# Patient Record
Sex: Male | Born: 1970 | Race: White | Hispanic: No | Marital: Married | Smoking: Former smoker
Health system: Southern US, Community
[De-identification: ages and names within clinical notes are randomized; demographics above are authoritative.]

## PROBLEM LIST (undated history)

## (undated) DIAGNOSIS — E785 Hyperlipidemia, unspecified: Secondary | ICD-10-CM

## (undated) DIAGNOSIS — I10 Essential (primary) hypertension: Secondary | ICD-10-CM

## (undated) HISTORY — PX: FINGER SURGERY: SHX640

## (undated) HISTORY — DX: Hyperlipidemia, unspecified: E78.5

---

## 2007-07-09 ENCOUNTER — Ambulatory Visit: Payer: Self-pay | Admitting: Family Medicine

## 2014-10-31 DIAGNOSIS — E785 Hyperlipidemia, unspecified: Secondary | ICD-10-CM | POA: Insufficient documentation

## 2014-11-05 ENCOUNTER — Ambulatory Visit: Payer: Self-pay | Admitting: Family Medicine

## 2015-01-09 ENCOUNTER — Encounter: Payer: Self-pay | Admitting: Emergency Medicine

## 2015-01-09 ENCOUNTER — Emergency Department
Admission: EM | Admit: 2015-01-09 | Discharge: 2015-01-09 | Disposition: A | Discharge status: Home / Self Care | Attending: Emergency Medicine | Admitting: Emergency Medicine

## 2015-01-09 DIAGNOSIS — X58XXXA Exposure to other specified factors, initial encounter: Secondary | ICD-10-CM | POA: Insufficient documentation

## 2015-01-09 DIAGNOSIS — Y9289 Other specified places as the place of occurrence of the external cause: Secondary | ICD-10-CM | POA: Diagnosis not present

## 2015-01-09 DIAGNOSIS — T1592XA Foreign body on external eye, part unspecified, left eye, initial encounter: Secondary | ICD-10-CM | POA: Diagnosis not present

## 2015-01-09 DIAGNOSIS — Y998 Other external cause status: Secondary | ICD-10-CM | POA: Insufficient documentation

## 2015-01-09 DIAGNOSIS — Z79899 Other long term (current) drug therapy: Secondary | ICD-10-CM | POA: Insufficient documentation

## 2015-01-09 DIAGNOSIS — Y9389 Activity, other specified: Secondary | ICD-10-CM | POA: Insufficient documentation

## 2015-01-09 MED ORDER — FLUORESCEIN SODIUM 1 MG OP STRP
1.0000 | ORAL_STRIP | Freq: Once | OPHTHALMIC | Status: AC
  Administered 2015-01-09: 1 via OPHTHALMIC
  Filled 2015-01-09 (×2): qty 1

## 2015-01-09 MED ORDER — TETRACAINE HCL 0.5 % OP SOLN
1.0000 [drp] | Freq: Once | OPHTHALMIC | Status: AC
  Administered 2015-01-09: 1 [drp] via OPHTHALMIC
  Filled 2015-01-09 (×2): qty 2

## 2015-01-09 MED ORDER — EYE WASH OPHTH SOLN
1.0000 [drp] | OPHTHALMIC | Status: DC | PRN
  Filled 2015-01-09 (×2): qty 118

## 2015-01-09 MED ORDER — TOBRAMYCIN 0.3 % OP SOLN
2.0000 [drp] | OPHTHALMIC | Status: DC
  Administered 2015-01-09: 2 [drp] via OPHTHALMIC
  Filled 2015-01-09: qty 5

## 2015-01-09 NOTE — Discharge Instructions (Signed)
Eye, Foreign Body °A foreign body is an object that should not be there. The object could be near, on, or in the eye. °HOME CARE °If your doctor prescribes an eye patch: °· Keep the eye patch on. Do this until you see your doctor again. °· Do not remove the patch to put in medicine unless your doctor tells you. °· Retape it as it was before: °¨ When replacing the patch. °¨ If the patch comes loose. °· Do not drive or use machinery. °· Only take medicine as told by your doctor. °If your doctor does not prescribe an eye patch: °· Keep the eye closed as much as possible. °· Do not rub the eye. °· Wear dark glasses in bright light. °· Do not wear contact lenses until the eye feels normal, or as told by your doctor. °· Wear protective eye covering, especially when using high speed tools. °· Only take medicine as told by your doctor. °GET HELP RIGHT AWAY IF:  °· Your pain gets worse. °· Your vision changes. °· You have problems with the eye patch. °· The injury gets larger. °· There is fluid (discharge) coming from the eye. °· You get puffiness (swelling) and soreness. °· You have an oral temperature above 102° F (38.9° C), not controlled by medicine. °· Your baby is older than 3 months with a rectal temperature of 102° F (38.9° C) or higher. °· Your baby is 3 months old or younger with a rectal temperature of 100.4° F (38° C) or higher. °MAKE SURE YOU:  °· Understand these instructions. °· Will watch your condition. °· Will get help right away if you are not doing well or get worse. °Document Released: 10/01/2009 Document Revised: 07/06/2011 Document Reviewed: 09/08/2012 °ExitCare® Patient Information ©2015 [employer]. This information is not intended to replace advice given to you by your health care provider. Make sure you discuss any questions you have with your health care provider. ° °

## 2015-01-09 NOTE — ED Notes (Signed)
Pt arrived to the ED for complaint of a foreign object in the left eye. Pt states that he was mowing the yard and thinks that is dirt. Pt is AOx4 in no apparent distress.

## 2015-01-09 NOTE — ED Provider Notes (Signed)
[facility] Emergency Department Provider Note  ____________________________________________  Time seen: Approximately 10:22 PM  I have reviewed the triage vital signs and the nursing notes.   HISTORY  Chief Complaint Foreign Body in Eye   HPI Jose Mosley is a 44 y.o. male is here with complaint of foreign body to left eye. He states he was mowing the yard and thinks that might be dirt. He says he took a shower and felt it at that time. He denies any visual problems other then the discomfort. He is tried multiple times at home to flush it out himself.Currently his pain is 1 out of 10.   Past Medical History  Diagnosis Date  . Hyperlipidemia     Patient Active Problem List   Diagnosis Date Noted  . Hyperlipidemia 10/31/2014    Past Surgical History  Procedure Laterality Date  . Finger surgery      Current Outpatient Rx  Name  Route  Sig  Dispense  Refill  . atorvastatin (LIPITOR) 40 MG tablet   Oral   Take 40 mg by mouth daily.           Allergies Review of patient's allergies indicates no known allergies.  Family History  Problem Relation Age of Onset  . Alcohol abuse Mother   . Diabetes Mother   . Heart disease Mother   . Hyperlipidemia Mother   . Stroke Mother   . Heart disease Father   . Hyperlipidemia Father   . Hypertension Father   . Stroke Father     Social History Social History  Substance Use Topics  . Smoking status: Never Smoker   . Smokeless tobacco: None  . Alcohol Use: No    Review of Systems Constitutional: No fever/chills Eyes: No visual changes. Cardiovascular: Denies chest pain. Respiratory: Denies shortness of breath.   10-point ROS otherwise negative.  ____________________________________________   PHYSICAL EXAM:  VITAL SIGNS: ED Triage Vitals  Enc Vitals Group     BP 01/09/15 2050 149/94 mmHg     Pulse Rate 01/09/15 2050 70     Resp 01/09/15 2050 18     Temp 01/09/15 2050 98.3 F  (36.8 C)     Temp Source 01/09/15 2050 Oral     SpO2 01/09/15 2050 97 %     Weight 01/09/15 2050 203 lb (92.08 kg)     Height 01/09/15 2050 5\' 8"  (1.727 m)     Head Cir --      Peak Flow --      Pain Score 01/09/15 2051 1     Pain Loc --      Pain Edu? --      Excl. in [place]? --     Constitutional: Alert and oriented. Well appearing and in no acute distress. Eyes: Conjunctivae are normal. PERRL. EOMI. Head: Atraumatic. Nose: No congestion/rhinnorhea. Neck: No stridor.   Marland KitchenRespiratory: Normal respiratory effort.  No retractions. Gastrointestinal: Soft and nontender. No distention. No abdominal bruits. No CVA tenderness. Musculoskeletal: No lower extremity tenderness nor edema.   Neurologic:  Normal speech and language. No gross focal neurologic deficits are appreciated. No gait instability. Skin:  Skin is warm, dry and intact. No rash noted. Psychiatric: Mood and affect are normal. Speech and behavior are normal.  ____________________________________________   LABS (all labs ordered are listed, but only abnormal results are displayed)  Labs Reviewed - No data to display  PROCEDURES  Procedure(s) performed: Tetracaine was placed in the left eye. Left eyelid was  inverted with a foreign body noted on the eyelid. This was removed with a wet Q-tip. Floor seen dye was placed in the eye after this and inspected without any corneal abrasion noted. Eye was irrigated with ophthalmic solution.  Critical Care performed: No  ____________________________________________   INITIAL IMPRESSION / ASSESSMENT AND PLAN / ED COURSE  Pertinent labs & imaging results that were available during my care of the patient were reviewed by me and considered in my medical decision making (see chart for details).  Patient was given ophthalmic eyedrops while in the emergency room since the pharmacies are closed. He is to follow-up with [facility] if any problems and 1  day. ____________________________________________   FINAL CLINICAL IMPRESSION(S) / ED DIAGNOSES  Final diagnoses:  Foreign body, eye, left, initial encounter      Johnn Hai, PA-C 01/09/15 2318  Daymon Larsen, MD 01/09/15 2320

## 2015-03-07 ENCOUNTER — Encounter: Payer: Self-pay | Admitting: Family Medicine

## 2015-03-07 ENCOUNTER — Ambulatory Visit (INDEPENDENT_AMBULATORY_CARE_PROVIDER_SITE_OTHER): Payer: Worker's Compensation | Admitting: Family Medicine

## 2015-03-07 VITALS — BP 127/78 | HR 60 | Temp 97.9°F | Ht 67.3 in | Wt 183.0 lb

## 2015-03-07 DIAGNOSIS — Z2089 Contact with and (suspected) exposure to other communicable diseases: Secondary | ICD-10-CM | POA: Insufficient documentation

## 2015-03-07 NOTE — Assessment & Plan Note (Signed)
Low risk exposure with no symptoms no need for prophylaxis

## 2015-03-07 NOTE — Progress Notes (Signed)
   BP 127/78 mmHg  Pulse 60  Temp(Src) 97.9 F (36.6 C)  Ht 5' 7.3" (1.709 m)  Wt 183 lb (83.008 kg)  BMI 28.42 kg/m2  SpO2 99%   Subjective:    Patient ID: Jose Mosley, male    DOB: 10-27-1970, 44 y.o.   MRN: [id number]  HPI: Jose Mosley is a 44 y.o. male  Chief Complaint  Patient presents with  . 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 Patient with absolutely no complaints no signs or symptoms of any illness, no fever or chills no neck stiffness no back stiffness no headache or other symptoms. Patient had no direct contact or exposure with patient Was at most 3 feet away from the patient who is intubated during the entire time. On review of company criteria for high risk exposure patient had no high risk exposure to meningitis.  Relevant past medical, surgical, family and social history reviewed and updated as indicated. Interim medical history since our last visit reviewed. Allergies and medications reviewed and updated.  Review of Systems  Constitutional: Negative.   HENT: Negative.   Eyes: Negative.   Respiratory: Negative.   Cardiovascular: Negative.   Gastrointestinal: Negative.   Endocrine: Negative.   Genitourinary: Negative.   Musculoskeletal: Negative.   Skin: Negative.   Allergic/Immunologic: Negative.   Neurological: Negative.   Hematological: Negative.   Psychiatric/Behavioral: Negative.     Per HPI unless specifically indicated above     Objective:    BP 127/78 mmHg  Pulse 60  Temp(Src) 97.9 F (36.6 C)  Ht 5' 7.3" (1.709 m)  Wt 183 lb (83.008 kg)  BMI 28.42 kg/m2  SpO2 99%  Wt Readings from Last 3 Encounters:  03/07/15 183 lb (83.008 kg)  01/09/15 203 lb (92.08 kg)  09/21/14 205 lb (92.987 kg)    Physical Exam  Constitutional: He is oriented to person, place, and time. He appears well-developed and well-nourished. No  distress.  HENT:  Head: Normocephalic and atraumatic.  Right Ear: Hearing and external ear normal.  Left Ear: Hearing and external ear normal.  Nose: Nose normal.  Mouth/Throat: Oropharynx is clear and moist.  Eyes: Conjunctivae, EOM and lids are normal. Pupils are equal, round, and reactive to light. Right eye exhibits no discharge. Left eye exhibits no discharge. No scleral icterus.  Neck: Normal range of motion. Neck supple. Thyromegaly present.  Cardiovascular: Normal rate, regular rhythm and normal heart sounds.   Pulmonary/Chest: Effort normal and breath sounds normal. No respiratory distress.  Musculoskeletal: Normal range of motion.  No neck or back soreness stiffness or limited range of motion  Lymphadenopathy:    He has no cervical adenopathy.  Neurological: He is alert and oriented to person, place, and time.  Skin: Skin is intact. No rash noted.  Psychiatric: He has a normal mood and affect. His speech is normal and behavior is normal. Judgment and thought content normal. Cognition and memory are normal.    No results found for this or any previous visit.    Assessment & Plan:   Problem List Items Addressed This Visit      Other   Meningitis exposure - Primary    Low risk exposure with no symptoms no need for prophylaxis          Follow up plan: Return for As scheduled.

## 2015-05-16 ENCOUNTER — Ambulatory Visit (INDEPENDENT_AMBULATORY_CARE_PROVIDER_SITE_OTHER): Admitting: Family Medicine

## 2015-05-16 ENCOUNTER — Encounter: Payer: Self-pay | Admitting: Family Medicine

## 2015-05-16 ENCOUNTER — Other Ambulatory Visit: Payer: Self-pay | Admitting: Family Medicine

## 2015-05-16 DIAGNOSIS — E785 Hyperlipidemia, unspecified: Secondary | ICD-10-CM

## 2015-05-16 LAB — LP+ALT+AST PICCOLO, WAIVED
ALT (SGPT) Piccolo, Waived: 21 U/L (ref 10–47)
AST (SGOT) Piccolo, Waived: 35 U/L (ref 11–38)
CHOL/HDL RATIO PICCOLO,WAIVE: 1.9 mg/dL
CHOLESTEROL PICCOLO, WAIVED: 158 mg/dL (ref <200)
HDL CHOL PICCOLO, WAIVED: 85 mg/dL (ref >59)
LDL CHOL CALC PICCOLO WAIVED: 61 mg/dL (ref <100)
TRIGLYCERIDES PICCOLO,WAIVED: 58 mg/dL (ref <150)
VLDL CHOL CALC PICCOLO,WAIVE: 12 mg/dL (ref <30)

## 2015-05-16 MED ORDER — ATORVASTATIN CALCIUM 40 MG PO TABS: 40.0000 mg | ORAL_TABLET | Freq: Every day | ORAL | Status: DC

## 2015-05-16 NOTE — Progress Notes (Signed)
BP 115/73 mmHg  Pulse 81  Temp(Src) 98 F (36.7 C)  Ht 5' 7.2" (1.707 m)  Wt 182 lb (82.555 kg)  BMI 28.33 kg/m2  SpO2 98%   Subjective:    Patient ID: Jose Mosley, male    DOB: [birth year], 45 y.o.   MRN: [id number]  HPI: Jose Mosley is a 45 y.o. male  Chief Complaint  Patient presents with  . Hyperlipidemia   HYPERLIPIDEMIA Hyperlipidemia status: controlled Sasfied with current treatment?  no Side effects:  no Medication compliance: excellent compliance Supplements: none Aspirin:  no Chest pain:  no Coronary artery disease:  no Family history CAD:  yes Family history early CAD:  yes   To be going to [place] in September, just got some shots for travel. To talk to Dr. Jeananne Rama later regarding medication when he is flying.   Relevant past medical, surgical, family and social history reviewed and updated as indicated. Interim medical history since our last visit reviewed. Allergies and medications reviewed and updated.  Review of Systems  Constitutional: Negative.   Respiratory: Negative.   Cardiovascular: Negative.   Gastrointestinal: Negative.   Psychiatric/Behavioral: Negative.     Per HPI unless specifically indicated above     Objective:    BP 115/73 mmHg  Pulse 81  Temp(Src) 98 F (36.7 C)  Ht 5' 7.2" (1.707 m)  Wt 182 lb (82.555 kg)  BMI 28.33 kg/m2  SpO2 98%  Wt Readings from Last 3 Encounters:  05/16/15 182 lb (82.555 kg)  03/07/15 183 lb (83.008 kg)  01/09/15 203 lb (92.08 kg)    Physical Exam  Constitutional: He is oriented to person, place, and time. He appears well-developed and well-nourished. No distress.  HENT:  Head: Normocephalic and atraumatic.  Right Ear: Hearing normal.  Left Ear: Hearing normal.  Nose: Nose normal.  Eyes: Conjunctivae and lids are normal. Right eye exhibits no discharge. Left eye exhibits no discharge. No scleral icterus.  Cardiovascular: Normal rate, regular rhythm, normal heart sounds and intact  distal pulses.  Exam reveals no gallop and no friction rub.   No murmur heard. Pulmonary/Chest: Effort normal and breath sounds normal. No respiratory distress. He has no wheezes. He has no rales. He exhibits no tenderness.  Musculoskeletal: Normal range of motion.  Neurological: He is alert and oriented to person, place, and time.  Skin: Skin is warm, dry and intact. No rash noted. No erythema. No pallor.  Psychiatric: He has a normal mood and affect. His speech is normal and behavior is normal. Judgment and thought content normal. Cognition and memory are normal.  Nursing note and vitals reviewed.   No results found for this or any previous visit.    Assessment & Plan:   Problem List Items Addressed This Visit      Other   Hyperlipidemia    Under good control at this time. Continue current regimen. Continue to monitor. Recheck at PE in 6 months.      Relevant Medications   atorvastatin (LIPITOR) 40 MG tablet       Follow up plan: Return in about 6 months (around 11/13/2015) for PE with MAC.

## 2015-05-16 NOTE — Assessment & Plan Note (Signed)
Under good control at this time. Continue current regimen. Continue to monitor. Recheck at PE in 6 months.

## 2015-08-09 DIAGNOSIS — M7540 Impingement syndrome of unspecified shoulder: Secondary | ICD-10-CM | POA: Insufficient documentation

## 2015-08-09 DIAGNOSIS — M25559 Pain in unspecified hip: Secondary | ICD-10-CM | POA: Insufficient documentation

## 2015-08-27 ENCOUNTER — Encounter: Payer: Self-pay | Admitting: Family Medicine

## 2015-08-27 ENCOUNTER — Ambulatory Visit (INDEPENDENT_AMBULATORY_CARE_PROVIDER_SITE_OTHER): Payer: Self-pay | Admitting: Family Medicine

## 2015-08-27 VITALS — BP 118/68 | HR 81 | Ht 68.2 in | Wt 174.0 lb

## 2015-08-27 DIAGNOSIS — Z0289 Encounter for other administrative examinations: Secondary | ICD-10-CM

## 2015-08-27 DIAGNOSIS — E785 Hyperlipidemia, unspecified: Secondary | ICD-10-CM

## 2015-08-27 NOTE — Progress Notes (Signed)
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

## 2015-11-14 ENCOUNTER — Ambulatory Visit: Admitting: Family Medicine

## 2015-12-26 ENCOUNTER — Encounter: Payer: Self-pay | Admitting: Family Medicine

## 2015-12-26 ENCOUNTER — Ambulatory Visit (INDEPENDENT_AMBULATORY_CARE_PROVIDER_SITE_OTHER): Admitting: Family Medicine

## 2015-12-26 VITALS — BP 131/85 | HR 62 | Temp 98.1°F | Ht 68.1 in | Wt 195.4 lb

## 2015-12-26 DIAGNOSIS — E785 Hyperlipidemia, unspecified: Secondary | ICD-10-CM

## 2015-12-26 LAB — LIPID PANEL PICCOLO, WAIVED
CHOLESTEROL PICCOLO, WAIVED: 172 mg/dL (ref <200)
Chol/HDL Ratio Piccolo,Waive: 2.1 mg/dL
HDL Chol Piccolo, Waived: 81 mg/dL (ref >59)
LDL Chol Calc Piccolo Waived: 67 mg/dL (ref <100)
Triglycerides Piccolo,Waived: 117 mg/dL (ref <150)
VLDL Chol Calc Piccolo,Waive: 23 mg/dL (ref <30)

## 2015-12-26 MED ORDER — ATORVASTATIN CALCIUM 40 MG PO TABS: 40.0000 mg | ORAL_TABLET | Freq: Every day | ORAL | 1 refills | Status: DC

## 2015-12-26 MED ORDER — ACETAZOLAMIDE 250 MG PO TABS: 250.0000 mg | ORAL_TABLET | Freq: Two times a day (BID) | ORAL | 0 refills | Status: DC

## 2015-12-26 NOTE — Patient Instructions (Signed)
Follow up as needed

## 2015-12-26 NOTE — Assessment & Plan Note (Signed)
Continue current regimen, lipid panel today stable and WNL.

## 2015-12-26 NOTE — Progress Notes (Signed)
BP 131/85 (BP Location: Left Arm, Patient Position: Sitting, Cuff Size: Large)   Pulse 62   Temp 98.1 F (36.7 C)   Ht 5' 8.1" (1.73 m)   Wt 195 lb 6.4 oz (88.6 kg)   SpO2 98%   BMI 29.62 kg/m    Subjective:    Patient ID: Jose Mosley, male    DOB: 09/23/1970, 45 y.o.   MRN: [id number]  HPI: Jose Mosley is a 45 y.o. male  Chief Complaint  Patient presents with  . Hyperlipidemia  . Other    pt states he would like a prescription for diamox or acetazolamide, states he is going on a hiking trip and the medication is for altitude.  . Medication Refill    pt states he needs refill for atorvastatin and needs prescriptions printed    Patient presents for hyperlipidemia f/u. Doing very well on atorvastatin, taking faithfully without side effects. Denies CP, SOB, myalgias, or weakness. Also trying to keep a balanced diet and good exercise regimen as well.   Traveling soon and wanting a script for diamox for altitude.   Relevant past medical, surgical, family and social history reviewed and updated as indicated. Interim medical history since our last visit reviewed. Allergies and medications reviewed and updated.  Review of Systems  Constitutional: Negative.   HENT: Negative.   Respiratory: Negative.   Cardiovascular: Negative.   Gastrointestinal: Negative.   Genitourinary: Negative.   Musculoskeletal: Negative.   Neurological: Negative.   Psychiatric/Behavioral: Negative.     Per HPI unless specifically indicated above     Objective:    BP 131/85 (BP Location: Left Arm, Patient Position: Sitting, Cuff Size: Large)   Pulse 62   Temp 98.1 F (36.7 C)   Ht 5' 8.1" (1.73 m)   Wt 195 lb 6.4 oz (88.6 kg)   SpO2 98%   BMI 29.62 kg/m   Wt Readings from Last 3 Encounters:  12/26/15 195 lb 6.4 oz (88.6 kg)  08/27/15 174 lb (78.9 kg)  05/16/15 182 lb (82.6 kg)    Physical Exam  Constitutional: He is oriented to person, place, and time. He appears  well-developed and well-nourished. No distress.  HENT:  Head: Atraumatic.  Eyes: Conjunctivae are normal. No scleral icterus.  Neck: Normal range of motion. Neck supple.  Cardiovascular: Normal rate, regular rhythm and normal heart sounds.   Pulmonary/Chest: Effort normal and breath sounds normal. No respiratory distress.  Musculoskeletal: Normal range of motion.  Neurological: He is alert and oriented to person, place, and time.  Skin: Skin is warm and dry.  Psychiatric: He has a normal mood and affect. His behavior is normal.  Nursing note and vitals reviewed.   Results for orders placed or performed in visit on 12/26/15  Lipid Panel Piccolo, [employer]  Result Value Ref Range   Cholesterol Piccolo, Waived 172 <200 mg/dL   HDL Chol Piccolo, Waived 81 >59 mg/dL   Triglycerides Piccolo,Waived 117 <150 mg/dL   Chol/HDL Ratio Piccolo,Waive 2.1 mg/dL   LDL Chol Calc Piccolo Waived 67 <100 mg/dL   VLDL Chol Calc Piccolo,Waive 23 <30 mg/dL      Assessment & Plan:   Problem List Items Addressed This Visit      Other   Hyperlipidemia - Primary    Continue current regimen, lipid panel today stable and WNL.       Relevant Medications   acetaZOLAMIDE (DIAMOX) 250 MG tablet   atorvastatin (LIPITOR) 40 MG tablet   Other Relevant Orders  Lipid Panel Piccolo, Waived (Completed)    Other Visit Diagnoses   None.      Follow up plan: Return in about 6 months (around 06/24/2016) for Physical Exam.

## 2016-05-07 ENCOUNTER — Other Ambulatory Visit: Payer: Self-pay | Admitting: Family Medicine

## 2016-06-25 ENCOUNTER — Ambulatory Visit: Payer: Self-pay | Admitting: Family Medicine

## 2016-08-27 ENCOUNTER — Telehealth: Payer: Self-pay | Admitting: Family Medicine

## 2016-08-27 NOTE — Telephone Encounter (Signed)
Patient has to be rescheduled do we have any open suggestions before I call patient to reschedule.  Thanks

## 2016-08-27 NOTE — Telephone Encounter (Signed)
Two physical slots available on Monday 08/30/16

## 2016-09-03 ENCOUNTER — Other Ambulatory Visit

## 2016-09-03 ENCOUNTER — Encounter: Payer: Self-pay | Admitting: Family Medicine

## 2016-09-03 ENCOUNTER — Ambulatory Visit (INDEPENDENT_AMBULATORY_CARE_PROVIDER_SITE_OTHER): Payer: Self-pay | Admitting: Family Medicine

## 2016-09-03 ENCOUNTER — Other Ambulatory Visit: Payer: Self-pay

## 2016-09-03 VITALS — BP 132/82 | HR 64 | Ht 68.75 in | Wt 210.0 lb

## 2016-09-03 DIAGNOSIS — Z0289 Encounter for other administrative examinations: Secondary | ICD-10-CM

## 2016-09-03 DIAGNOSIS — E7800 Pure hypercholesterolemia, unspecified: Secondary | ICD-10-CM

## 2016-09-03 DIAGNOSIS — Z029 Encounter for administrative examinations, unspecified: Secondary | ICD-10-CM

## 2016-09-03 LAB — LP+ALT+AST PICCOLO, WAIVED
ALT (SGPT) PICCOLO, WAIVED: 45 U/L (ref 10–47)
AST (SGOT) PICCOLO, WAIVED: 36 U/L (ref 11–38)
CHOLESTEROL PICCOLO, WAIVED: 176 mg/dL (ref <200)
Chol/HDL Ratio Piccolo,Waive: 2 mg/dL
HDL Chol Piccolo, Waived: 89 mg/dL (ref >59)
LDL Chol Calc Piccolo Waived: 70 mg/dL (ref <100)
Triglycerides Piccolo,Waived: 87 mg/dL (ref <150)
VLDL CHOL CALC PICCOLO,WAIVE: 17 mg/dL (ref <30)

## 2016-09-03 NOTE — Progress Notes (Signed)
   BP 132/82   Pulse 64   Ht 5' 8.75" (1.746 m)   Wt 210 lb (95.3 kg)   BMI 31.24 kg/m    Subjective:    Patient ID: Jose Mosley, male    DOB: 1970/08/22, 46 y.o.   MRN: [id number]  HPI: Jose Mosley is a 46 y.o. male  Chief Complaint  Patient presents with  . FAA    CLASS 2     Relevant past medical, surgical, family and social history reviewed and updated as indicated. Interim medical history since our last visit reviewed. Allergies and medications reviewed and updated.  Review of Systems  Constitutional: Negative.   HENT: Negative.   Eyes: Negative.   Respiratory: Negative.   Cardiovascular: Negative.   Gastrointestinal: Negative.   Endocrine: Negative.   Genitourinary: Negative.   Musculoskeletal: Negative.   Skin: Negative.   Allergic/Immunologic: Negative.   Neurological: Negative.   Hematological: Negative.   Psychiatric/Behavioral: Negative.     Per HPI unless specifically indicated above     Objective:    BP 132/82   Pulse 64   Ht 5' 8.75" (1.746 m)   Wt 210 lb (95.3 kg)   BMI 31.24 kg/m   Wt Readings from Last 3 Encounters:  09/03/16 210 lb (95.3 kg)  12/26/15 195 lb 6.4 oz (88.6 kg)  08/27/15 174 lb (78.9 kg)    Physical Exam  Constitutional: He is oriented to person, place, and time. He appears well-developed and well-nourished.  HENT:  Head: Normocephalic.  Right Ear: External ear normal.  Left Ear: External ear normal.  Nose: Nose normal.  Eyes: Conjunctivae and EOM are normal. Pupils are equal, round, and reactive to light.  Neck: Normal range of motion. Neck supple. No thyromegaly present.  Cardiovascular: Normal rate, regular rhythm, normal heart sounds and intact distal pulses.   Pulmonary/Chest: Effort normal and breath sounds normal.  Abdominal: Soft. Bowel sounds are normal. There is no splenomegaly or hepatomegaly.  Genitourinary: Penis normal.  Musculoskeletal: Normal range of motion.  Lymphadenopathy:    He has no  cervical adenopathy.  Neurological: He is alert and oriented to person, place, and time. He has normal reflexes.  Skin: Skin is warm and dry.  Psychiatric: He has a normal mood and affect. His behavior is normal. Judgment and thought content normal.    Results for orders placed or performed in visit on 12/26/15  Lipid Panel Piccolo, [employer]  Result Value Ref Range   Cholesterol Piccolo, Waived 172 <200 mg/dL   HDL Chol Piccolo, Waived 81 >59 mg/dL   Triglycerides Piccolo,Waived 117 <150 mg/dL   Chol/HDL Ratio Piccolo,Waive 2.1 mg/dL   LDL Chol Calc Piccolo Waived 67 <100 mg/dL   VLDL Chol Calc Piccolo,Waive 23 <30 mg/dL      Assessment & Plan:   Problem List Items Addressed This Visit      Other   Hyperlipidemia    The current medical regimen is effective;  continue present plan and medications.       Encounter for [employer]) examination - Primary       Follow up plan: No Follow-up on file.

## 2016-09-03 NOTE — Assessment & Plan Note (Signed)
The current medical regimen is effective;  continue present plan and medications.  

## 2016-09-10 ENCOUNTER — Other Ambulatory Visit: Payer: Self-pay | Admitting: Family Medicine

## 2016-09-10 MED ORDER — ATORVASTATIN CALCIUM 40 MG PO TABS
ORAL_TABLET | ORAL | 0 refills | Status: DC
Start: 2016-09-10 — End: 2017-01-06

## 2016-09-10 NOTE — Telephone Encounter (Signed)
Last OV: 09/03/16 Next OV: None on file.   Lab Results  Component Value Date   CHOL 176 09/03/2016   TRIG 87 09/03/2016   No results found for: CREATININE, BUN, NA, K, CL, CO2

## 2016-09-10 NOTE — Telephone Encounter (Signed)
Patient called stating he needed Dr Jeananne Rama to send in his script for Lipitor 40mg  once daily.  [facility] on [address] [place]  [place]

## 2016-09-14 DIAGNOSIS — Z0289 Encounter for other administrative examinations: Secondary | ICD-10-CM | POA: Insufficient documentation

## 2016-09-14 DIAGNOSIS — Z029 Encounter for administrative examinations, unspecified: Principal | ICD-10-CM

## 2017-01-06 ENCOUNTER — Other Ambulatory Visit: Payer: Self-pay | Admitting: Family Medicine

## 2017-01-06 MED ORDER — ATORVASTATIN CALCIUM 40 MG PO TABS: ORAL_TABLET | ORAL | 0 refills | Status: DC

## 2017-01-06 NOTE — Telephone Encounter (Signed)
Patient called to request a refill on his prescription for Lipitor to [facility] in [place].  Please Advise.  Thank you

## 2017-01-06 NOTE — Telephone Encounter (Signed)
Called and schedule patient for OV in October.

## 2017-02-11 ENCOUNTER — Ambulatory Visit: Payer: Self-pay | Admitting: Family Medicine

## 2017-02-15 ENCOUNTER — Ambulatory Visit: Admitting: Family Medicine

## 2017-09-08 ENCOUNTER — Encounter: Payer: Self-pay | Admitting: Unknown Physician Specialty

## 2017-09-08 ENCOUNTER — Ambulatory Visit (INDEPENDENT_AMBULATORY_CARE_PROVIDER_SITE_OTHER): Admitting: Unknown Physician Specialty

## 2017-09-08 VITALS — BP 149/93 | HR 66 | Temp 98.2°F | Ht 68.5 in | Wt 209.2 lb

## 2017-09-08 DIAGNOSIS — R1011 Right upper quadrant pain: Secondary | ICD-10-CM

## 2017-09-08 LAB — UA/M W/RFLX CULTURE, ROUTINE
BILIRUBIN UA: NEGATIVE
Glucose, UA: NEGATIVE
Ketones, UA: NEGATIVE
Leukocytes, UA: NEGATIVE
NITRITE UA: NEGATIVE
PH UA: 7 (ref 5.0–7.5)
PROTEIN UA: NEGATIVE
Specific Gravity, UA: 1.015 (ref 1.005–1.030)
UUROB: 0.2 mg/dL (ref 0.2–1.0)

## 2017-09-08 LAB — MICROSCOPIC EXAMINATION

## 2017-09-08 NOTE — Progress Notes (Signed)
BP (!) 149/93 (BP Location: Left Arm, Cuff Size: Large)   Pulse 66   Temp 98.2 F (36.8 C) (Oral)   Ht 5' 8.5" (1.74 m)   Wt 209 lb 3.2 oz (94.9 kg)   SpO2 97%   BMI 31.35 kg/m    Subjective:    Patient ID: Jose Mosley, male    DOB: 05-31-1970, 47 y.o.   MRN: [id number]  HPI: Jose Mosley is a 47 y.o. male  Chief Complaint  Patient presents with  . Abdominal Pain    pt states he has had pain in his right side that radiates to the front. States he has noticied it for a few months but has gotten worse in the last 2 weeks    Abdominal Pain  This is a new problem. Episode onset: 3 months. The onset quality is gradual. The problem occurs constantly (comes and goes.  Seeems to more frequent and worse). The problem has been gradually worsening. The pain is located in the RUQ (Radiates to back). The quality of the pain is aching. The abdominal pain radiates to the back. Pertinent negatives include no anorexia, arthralgias, belching, constipation, diarrhea, dysuria, fever, flatus, frequency, headaches, hematochezia, hematuria, melena, myalgias, nausea, vomiting or weight loss. The pain is aggravated by eating (expecially after dinner which is a big meal). Relieved by: movement. He has tried nothing for the symptoms. There is no history of abdominal surgery, gallstones, GERD or irritable bowel syndrome.   Was drinking heavily but quit once concerned about pain  Social History   Socioeconomic History  . Marital status: Married    Spouse name: Not on file  . Number of children: Not on file  . Years of education: Not on file  . Highest education level: Not on file  Occupational History  . Not on file  Social Needs  . Financial resource strain: Not on file  . Food insecurity:    Worry: Not on file    Inability: Not on file  . Transportation needs:    Medical: Not on file    Non-medical: Not on file  Tobacco Use  . Smoking status: Former Smoker    Last attempt to quit:  03/06/1990    Years since quitting: 27.5  . Smokeless tobacco: Never Used  Substance and Sexual Activity  . Alcohol use: No    Alcohol/week: 0.0 oz  . Drug use: No  . Sexual activity: Yes  Lifestyle  . Physical activity:    Days per week: Not on file    Minutes per session: Not on file  . Stress: Not on file  Relationships  . Social connections:    Talks on phone: Not on file    Gets together: Not on file    Attends religious service: Not on file    Active member of club or organization: Not on file    Attends meetings of clubs or organizations: Not on file    Relationship status: Not on file  . Intimate partner violence:    Fear of current or ex partner: Not on file    Emotionally abused: Not on file    Physically abused: Not on file    Forced sexual activity: Not on file  Other Topics Concern  . Not on file  Social History Narrative  . Not on file     Relevant past medical, surgical, family and social history reviewed and updated as indicated. Interim medical history since our last visit reviewed. Allergies and medications  reviewed and updated.  Review of Systems  Constitutional: Negative for fever and weight loss.  Gastrointestinal: Positive for abdominal pain. Negative for anorexia, constipation, diarrhea, flatus, hematochezia, melena, nausea and vomiting.  Genitourinary: Negative for dysuria, frequency and hematuria.  Musculoskeletal: Negative for arthralgias and myalgias.  Neurological: Negative for headaches.    Per HPI unless specifically indicated above     Objective:    BP (!) 149/93 (BP Location: Left Arm, Cuff Size: Large)   Pulse 66   Temp 98.2 F (36.8 C) (Oral)   Ht 5' 8.5" (1.74 m)   Wt 209 lb 3.2 oz (94.9 kg)   SpO2 97%   BMI 31.35 kg/m   Wt Readings from Last 3 Encounters:  09/08/17 209 lb 3.2 oz (94.9 kg)  09/03/16 210 lb (95.3 kg)  12/26/15 195 lb 6.4 oz (88.6 kg)    Physical Exam  Constitutional: He is oriented to person, place, and  time. He appears well-developed and well-nourished. No distress.  HENT:  Head: Normocephalic and atraumatic.  Eyes: Conjunctivae and lids are normal. Right eye exhibits no discharge. Left eye exhibits no discharge. No scleral icterus.  Neck: Normal range of motion. Neck supple. No JVD present. Carotid bruit is not present.  Cardiovascular: Normal rate, regular rhythm and normal heart sounds.  Pulmonary/Chest: Effort normal and breath sounds normal. No respiratory distress.  Abdominal: Normal appearance and bowel sounds are normal. There is no splenomegaly or hepatomegaly. There is no tenderness. There is no rebound and no guarding.  Musculoskeletal: Normal range of motion.  Neurological: He is alert and oriented to person, place, and time.  Skin: Skin is warm, dry and intact. No rash noted. No pallor.  Psychiatric: He has a normal mood and affect. His behavior is normal. Judgment and thought content normal.    Results for orders placed or performed in visit on 09/03/16  LP+ALT+AST Piccolo, [employer]  Result Value Ref Range   ALT (SGPT) Piccolo, Waived 45 10 - 47 U/L   AST (SGOT) Piccolo, Waived 36 11 - 38 U/L   Cholesterol Piccolo, Waived 176 <200 mg/dL   HDL Chol Piccolo, Waived 89 >59 mg/dL   Triglycerides Piccolo,Waived 87 <150 mg/dL   Chol/HDL Ratio Piccolo,Waive 2.0 mg/dL   LDL Chol Calc Piccolo Waived 70 <100 mg/dL   VLDL Chol Calc Piccolo,Waive 17 <30 mg/dL      Assessment & Plan:   Problem List Items Addressed This Visit    None    Visit Diagnoses    Right upper quadrant abdominal pain    -  Primary   New problem times 3 months worsening in frequency and severity.  Check labs.  Order [place].  Will f/u with results   Relevant Orders   UA/M w/rflx Culture, Routine   CBC with Differential/Platelet   Comprehensive metabolic panel   Amylase   Lipase   US Abdomen Complete       Follow up plan: Return in about 2 weeks (around 09/22/2017).

## 2017-09-09 ENCOUNTER — Other Ambulatory Visit: Payer: Self-pay | Admitting: Family Medicine

## 2017-09-09 ENCOUNTER — Encounter: Payer: Self-pay | Admitting: Unknown Physician Specialty

## 2017-09-09 LAB — COMPREHENSIVE METABOLIC PANEL
ALT: 23 IU/L (ref 0–44)
AST: 20 IU/L (ref 0–40)
Albumin/Globulin Ratio: 2 (ref 1.2–2.2)
Albumin: 4.5 g/dL (ref 3.5–5.5)
Alkaline Phosphatase: 56 IU/L (ref 39–117)
BUN/Creatinine Ratio: 9 (ref 9–20)
BUN: 8 mg/dL (ref 6–24)
Bilirubin Total: 0.7 mg/dL (ref 0.0–1.2)
CALCIUM: 9.4 mg/dL (ref 8.7–10.2)
CO2: 23 mmol/L (ref 20–29)
CREATININE: 0.88 mg/dL (ref 0.76–1.27)
Chloride: 104 mmol/L (ref 96–106)
GFR calc Af Amer: 118 mL/min/{1.73_m2} (ref >59)
GFR, EST NON AFRICAN AMERICAN: 102 mL/min/{1.73_m2} (ref >59)
Globulin, Total: 2.2 g/dL (ref 1.5–4.5)
Glucose: 84 mg/dL (ref 65–99)
Potassium: 4.3 mmol/L (ref 3.5–5.2)
Sodium: 142 mmol/L (ref 134–144)
Total Protein: 6.7 g/dL (ref 6.0–8.5)

## 2017-09-09 LAB — CBC WITH DIFFERENTIAL/PLATELET
Basophils Absolute: 0 10*3/uL (ref 0.0–0.2)
Basos: 1 %
EOS (ABSOLUTE): 0.1 10*3/uL (ref 0.0–0.4)
EOS: 2 %
HEMATOCRIT: 44.7 % (ref 37.5–51.0)
Hemoglobin: 15.2 g/dL (ref 13.0–17.7)
IMMATURE GRANULOCYTES: 0 %
Immature Grans (Abs): 0 10*3/uL (ref 0.0–0.1)
Lymphocytes Absolute: 1.5 10*3/uL (ref 0.7–3.1)
Lymphs: 23 %
MCH: 30.8 pg (ref 26.6–33.0)
MCHC: 34 g/dL (ref 31.5–35.7)
MCV: 91 fL (ref 79–97)
Monocytes Absolute: 0.6 10*3/uL (ref 0.1–0.9)
Monocytes: 10 %
NEUTROS PCT: 64 %
Neutrophils Absolute: 4 10*3/uL (ref 1.4–7.0)
PLATELETS: 262 10*3/uL (ref 150–379)
RBC: 4.94 x10E6/uL (ref 4.14–5.80)
RDW: 13.3 % (ref 12.3–15.4)
WBC: 6.2 10*3/uL (ref 3.4–10.8)

## 2017-09-09 LAB — LIPASE: Lipase: 22 U/L (ref 13–78)

## 2017-09-09 LAB — AMYLASE: Amylase: 71 U/L (ref 31–124)

## 2017-09-09 MED ORDER — ATORVASTATIN CALCIUM 40 MG PO TABS: ORAL_TABLET | ORAL | 0 refills | Status: DC

## 2017-09-10 ENCOUNTER — Telehealth: Payer: Self-pay | Admitting: Unknown Physician Specialty

## 2017-09-10 NOTE — Telephone Encounter (Signed)
Can you help me with this?  I sent a letter as I imagine he didn't have mychart at the time.  Can we figure out how to go back and release labs in [place]?

## 2017-09-14 ENCOUNTER — Ambulatory Visit

## 2017-09-15 ENCOUNTER — Ambulatory Visit
Admission: RE | Admit: 2017-09-15 | Discharge: 2017-09-15 | Disposition: A | Discharge status: Home / Self Care | Source: Ambulatory Visit | Attending: Unknown Physician Specialty | Admitting: Unknown Physician Specialty

## 2017-09-15 DIAGNOSIS — N200 Calculus of kidney: Secondary | ICD-10-CM | POA: Diagnosis not present

## 2017-09-15 DIAGNOSIS — R1011 Right upper quadrant pain: Secondary | ICD-10-CM | POA: Insufficient documentation

## 2017-09-17 ENCOUNTER — Ambulatory Visit (INDEPENDENT_AMBULATORY_CARE_PROVIDER_SITE_OTHER): Admitting: Unknown Physician Specialty

## 2017-09-17 ENCOUNTER — Ambulatory Visit
Admission: RE | Admit: 2017-09-17 | Discharge: 2017-09-17 | Disposition: A | Discharge status: Home / Self Care | Source: Ambulatory Visit | Attending: Unknown Physician Specialty | Admitting: Unknown Physician Specialty

## 2017-09-17 ENCOUNTER — Encounter: Payer: Self-pay | Admitting: Unknown Physician Specialty

## 2017-09-17 VITALS — BP 150/82 | HR 52 | Temp 98.3°F | Ht 68.5 in | Wt 206.6 lb

## 2017-09-17 DIAGNOSIS — M546 Pain in thoracic spine: Secondary | ICD-10-CM | POA: Insufficient documentation

## 2017-09-17 MED ORDER — OMEPRAZOLE 20 MG PO CPDR: 20.0000 mg | DELAYED_RELEASE_CAPSULE | Freq: Every day | ORAL | 3 refills | Status: DC

## 2017-09-17 NOTE — Progress Notes (Signed)
BP (!) 150/82   Pulse (!) 52   Temp 98.3 F (36.8 C) (Oral)   Ht 5' 8.5" (1.74 m)   Wt 206 lb 9.6 oz (93.7 kg)   SpO2 97%   BMI 30.96 kg/m    Subjective:    Patient ID: Jose Mosley, male    DOB: 01-Aug-1970, 46 y.o.   MRN: [id number]  HPI: Jose Mosley is a 47 y.o. male  Chief Complaint  Patient presents with  . Results    pt states he is here to discuss his ultrasound results   Pt is here to f/u RUQ pain.  Pt states it is worse last night and when he eats.  Pt states late in the day it is worse.  Notices it when he is still, better when he moves around.  [place] was negative.  Labs were normal  Today, pt points to right posterior and anterior chest where there is pain.  No SOB, cough.    Relevant past medical, surgical, family and social history reviewed and updated as indicated. Interim medical history since our last visit reviewed. Allergies and medications reviewed and updated.  Review of Systems  Per HPI unless specifically indicated above     Objective:    BP (!) 150/82   Pulse (!) 52   Temp 98.3 F (36.8 C) (Oral)   Ht 5' 8.5" (1.74 m)   Wt 206 lb 9.6 oz (93.7 kg)   SpO2 97%   BMI 30.96 kg/m   Wt Readings from Last 3 Encounters:  09/17/17 206 lb 9.6 oz (93.7 kg)  09/08/17 209 lb 3.2 oz (94.9 kg)  09/03/16 210 lb (95.3 kg)    Physical Exam  Constitutional: He is oriented to person, place, and time. He appears well-developed and well-nourished. No distress.  HENT:  Head: Normocephalic and atraumatic.  Eyes: Conjunctivae and lids are normal. Right eye exhibits no discharge. Left eye exhibits no discharge. No scleral icterus.  Neck: Normal range of motion. Neck supple. No JVD present. Carotid bruit is not present.  Cardiovascular: Normal rate, regular rhythm and normal heart sounds.  Pulmonary/Chest: Effort normal and breath sounds normal. No respiratory distress.  Abdominal: Soft. Normal appearance. There is no splenomegaly or hepatomegaly. There is  no tenderness. There is no rebound and no guarding.  Musculoskeletal: Normal range of motion.  Neurological: He is alert and oriented to person, place, and time.  Skin: Skin is warm, dry and intact. No rash noted. No pallor.  Psychiatric: He has a normal mood and affect. His behavior is normal. Judgment and thought content normal.    Results for orders placed or performed in visit on 09/08/17  Microscopic Examination  Result Value Ref Range   WBC, UA 0-5 0 - 5 /hpf   RBC, UA 0-2 0 - 2 /hpf   Epithelial Cells (non renal) 0-10 0 - 10 /hpf   Crystals Present N/A   Crystal Type Calcium Oxalate N/A   Bacteria, UA Few None seen/Few  UA/M w/rflx Culture, Routine  Result Value Ref Range   Specific Gravity, UA 1.015 1.005 - 1.030   pH, UA 7.0 5.0 - 7.5   Color, UA Yellow Yellow   Appearance Ur Clear Clear   Leukocytes, UA Negative Negative   Protein, UA Negative Negative/Trace   Glucose, UA Negative Negative   Ketones, UA Negative Negative   RBC, UA Trace (A) Negative   Bilirubin, UA Negative Negative   Urobilinogen, Ur 0.2 0.2 - 1.0 mg/dL  Nitrite, UA Negative Negative   Microscopic Examination See below:   CBC with Differential/Platelet  Result Value Ref Range   WBC 6.2 3.4 - 10.8 x10E3/uL   RBC 4.94 4.14 - 5.80 x10E6/uL   Hemoglobin 15.2 13.0 - 17.7 g/dL   Hematocrit 44.7 37.5 - 51.0 %   MCV 91 79 - 97 fL   MCH 30.8 26.6 - 33.0 pg   MCHC 34.0 31.5 - 35.7 g/dL   RDW 13.3 12.3 - 15.4 %   Platelets 262 150 - 379 x10E3/uL   Neutrophils 64 Not Estab. %   Lymphs 23 Not Estab. %   Monocytes 10 Not Estab. %   Eos 2 Not Estab. %   Basos 1 Not Estab. %   Neutrophils Absolute 4.0 1.4 - 7.0 x10E3/uL   Lymphocytes Absolute 1.5 0.7 - 3.1 x10E3/uL   Monocytes Absolute 0.6 0.1 - 0.9 x10E3/uL   EOS (ABSOLUTE) 0.1 0.0 - 0.4 x10E3/uL   Basophils Absolute 0.0 0.0 - 0.2 x10E3/uL   Immature Granulocytes 0 Not Estab. %   Immature Grans (Abs) 0.0 0.0 - 0.1 x10E3/uL  Comprehensive metabolic  panel  Result Value Ref Range   Glucose 84 65 - 99 mg/dL   BUN 8 6 - 24 mg/dL   Creatinine, Ser 0.88 0.76 - 1.27 mg/dL   GFR calc non Af Amer 102 >59 mL/min/1.73   GFR calc Af Amer 118 >59 mL/min/1.73   BUN/Creatinine Ratio 9 9 - 20   Sodium 142 134 - 144 mmol/L   Potassium 4.3 3.5 - 5.2 mmol/L   Chloride 104 96 - 106 mmol/L   CO2 23 20 - 29 mmol/L   Calcium 9.4 8.7 - 10.2 mg/dL   Total Protein 6.7 6.0 - 8.5 g/dL   Albumin 4.5 3.5 - 5.5 g/dL   Globulin, Total 2.2 1.5 - 4.5 g/dL   Albumin/Globulin Ratio 2.0 1.2 - 2.2   Bilirubin Total 0.7 0.0 - 1.2 mg/dL   Alkaline Phosphatase 56 39 - 117 IU/L   AST 20 0 - 40 IU/L   ALT 23 0 - 44 IU/L  Amylase  Result Value Ref Range   Amylase 71 31 - 124 U/L  Lipase  Result Value Ref Range   Lipase 22 13 - 78 U/L      Assessment & Plan:   Problem List Items Addressed This Visit    None    Visit Diagnoses    Right-sided thoracic back pain, unspecified chronicity    -  Primary   Pt is now complaing of pain more in the right chest area.  ?radiated pain from acid reflux?  Will rx Omeprazole and get chest x-ray   Relevant Orders   DG Chest 2 View       Follow up plan: Return if symptoms worsen or fail to improve.

## 2017-09-21 NOTE — Progress Notes (Signed)
Pt notified through mychart.

## 2017-09-24 ENCOUNTER — Ambulatory Visit: Admitting: Unknown Physician Specialty

## 2017-10-12 ENCOUNTER — Encounter: Payer: Self-pay | Admitting: Unknown Physician Specialty

## 2018-02-04 ENCOUNTER — Other Ambulatory Visit: Payer: Self-pay | Admitting: Family Medicine

## 2018-02-04 NOTE — Telephone Encounter (Signed)
Requested Prescriptions  Pending Prescriptions Disp Refills  . atorvastatin (LIPITOR) 40 MG tablet [Pharmacy Med Name: ATORVASTATIN 40MG    TAB] 90 tablet 0    Sig: TAKE 1 TABLET BY MOUTH ONCE DAILY AT 6 PM     Cardiovascular:  Antilipid - Statins Failed - 02/04/2018  9:21 AM      Failed - Total Cholesterol in normal range and within 360 days    Cholesterol Piccolo, Waived  Date Value Ref Range Status  09/03/2016 176 <200 mg/dL Final    Comment:                            Desirable                <200                         Borderline High      200- 239                         High                     >239          Failed - LDL in normal range and within 360 days    No results found for: LDLCALC, LDLC, HIRISKLDL       Failed - HDL in normal range and within 360 days    No results found for: HDL       Failed - Triglycerides in normal range and within 360 days    Triglycerides Piccolo,Waived  Date Value Ref Range Status  09/03/2016 87 <150 mg/dL Final    Comment:                            Normal                   <150                         Borderline High     150 - 199                         High                200 - 499                         Very High                >499          Passed - Patient is not pregnant      Passed - Valid encounter within last 12 months    Recent Outpatient Visits          4 months ago Right-sided thoracic back pain, unspecified chronicity   [facility] [place], Malachy Mood, NP   4 months ago Right upper quadrant abdominal pain   [facility] Kathrine Haddock, NP   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

## 2018-04-01 ENCOUNTER — Ambulatory Visit (INDEPENDENT_AMBULATORY_CARE_PROVIDER_SITE_OTHER): Admitting: Family Medicine

## 2018-04-01 ENCOUNTER — Encounter: Payer: Self-pay | Admitting: Family Medicine

## 2018-04-01 VITALS — BP 144/77 | HR 60 | Temp 99.4°F | Ht 68.5 in | Wt 212.1 lb

## 2018-04-01 DIAGNOSIS — I10 Essential (primary) hypertension: Secondary | ICD-10-CM | POA: Insufficient documentation

## 2018-04-01 DIAGNOSIS — E7800 Pure hypercholesterolemia, unspecified: Secondary | ICD-10-CM

## 2018-04-01 MED ORDER — ATORVASTATIN CALCIUM 40 MG PO TABS: ORAL_TABLET | ORAL | 1 refills | Status: DC

## 2018-04-01 MED ORDER — LISINOPRIL 10 MG PO TABS: 10.0000 mg | ORAL_TABLET | Freq: Every day | ORAL | 0 refills | Status: DC

## 2018-04-01 NOTE — Assessment & Plan Note (Signed)
Recheck lipids, continue current regimen. Lifestyle modifications encouraged

## 2018-04-01 NOTE — Progress Notes (Signed)
BP (!) 144/77 (BP Location: Left Arm, Cuff Size: Large)   Pulse 60   Temp 99.4 F (37.4 C) (Oral)   Ht 5' 8.5" (1.74 m)   Wt 212 lb 1.6 oz (96.2 kg)   SpO2 96%   BMI 31.78 kg/m    Subjective:    Patient ID: Jose Mosley, male    DOB: 1970-07-30, 47 y.o.   MRN: [id number]  HPI: Jose Mosley is a 47 y.o. male  Chief Complaint  Patient presents with  . 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 Just bought a home BP cuff and used it for this first time this morning and got a reading in the high 120s/80s. Has never been on a BP medicine before. Denies CP, SOB, visual issues, HAs, dizziness.   Doing well on lipitor for cholesterol management. Denies claudication, myalgias, Cp, SOB. Diet and exercise could use some improvement per patient.   Past Medical History:  Diagnosis Date  . Hyperlipidemia    Social History   Socioeconomic History  . Marital status: Legally Separated    Spouse name: Not on file  . Number of children: Not on file  . Years of education: Not on file  . Highest education level: Not on file  Occupational History  . Not on file  Social Needs  . Financial resource strain: Not on file  . Food insecurity:    Worry: Not on file    Inability: Not on file  . Transportation needs:    Medical: Not on file    Non-medical: Not on file  Tobacco Use  . Smoking status: Former Smoker    Last attempt to quit: 03/06/1990    Years since quitting: 28.0  . Smokeless tobacco: Never Used  Substance and Sexual Activity  . Alcohol use: No    Alcohol/week: 0.0 standard drinks  . Drug use: No  . Sexual activity: Yes  Lifestyle  . Physical activity:    Days per week: Not on file    Minutes per session: Not on file  . Stress: Not on file  Relationships  . Social connections:    Talks on phone: Not on file    Gets  together: Not on file    Attends religious service: Not on file    Active member of club or organization: Not on file    Attends meetings of clubs or organizations: Not on file    Relationship status: Not on file  . Intimate partner violence:    Fear of current or ex partner: Not on file    Emotionally abused: Not on file    Physically abused: Not on file    Forced sexual activity: Not on file  Other Topics Concern  . Not on file  Social History Narrative  . Not on file    Relevant past medical, surgical, family and social history reviewed and updated as indicated. Interim medical history since our last visit reviewed. Allergies and medications reviewed and updated.  Review of Systems  Per HPI unless specifically indicated above     Objective:    BP (!) 144/77 (BP Location: Left Arm, Cuff Size: Large)   Pulse 60   Temp 99.4 F (37.4 C) (Oral)   Ht 5' 8.5" (1.74 m)   Wt 212 lb 1.6 oz (96.2 kg)   SpO2  96%   BMI 31.78 kg/m   Wt Readings from Last 3 Encounters:  04/01/18 212 lb 1.6 oz (96.2 kg)  09/17/17 206 lb 9.6 oz (93.7 kg)  09/08/17 209 lb 3.2 oz (94.9 kg)    Physical Exam  Constitutional: He is oriented to person, place, and time. He appears well-developed and well-nourished. No distress.  HENT:  Head: Atraumatic.  Eyes: Pupils are equal, round, and reactive to light. Conjunctivae and EOM are normal.  Neck: Normal range of motion. Neck supple.  Cardiovascular: Normal rate, regular rhythm and normal heart sounds.  Pulmonary/Chest: Effort normal and breath sounds normal.  Musculoskeletal: Normal range of motion.  Neurological: He is alert and oriented to person, place, and time.  Skin: Skin is warm and dry.  Psychiatric: He has a normal mood and affect. His behavior is normal. Thought content normal.  Nursing note and vitals reviewed.   Results for orders placed or performed in visit on 09/08/17  Microscopic Examination  Result Value Ref Range   WBC, UA 0-5 0  - 5 /hpf   RBC, UA 0-2 0 - 2 /hpf   Epithelial Cells (non renal) 0-10 0 - 10 /hpf   Crystals Present N/A   Crystal Type Calcium Oxalate N/A   Bacteria, UA Few None seen/Few  UA/M w/rflx Culture, Routine  Result Value Ref Range   Specific Gravity, UA 1.015 1.005 - 1.030   pH, UA 7.0 5.0 - 7.5   Color, UA Yellow Yellow   Appearance Ur Clear Clear   Leukocytes, UA Negative Negative   Protein, UA Negative Negative/Trace   Glucose, UA Negative Negative   Ketones, UA Negative Negative   RBC, UA Trace (A) Negative   Bilirubin, UA Negative Negative   Urobilinogen, Ur 0.2 0.2 - 1.0 mg/dL   Nitrite, UA Negative Negative   Microscopic Examination See below:   CBC with Differential/Platelet  Result Value Ref Range   WBC 6.2 3.4 - 10.8 x10E3/uL   RBC 4.94 4.14 - 5.80 x10E6/uL   Hemoglobin 15.2 13.0 - 17.7 g/dL   Hematocrit 44.7 37.5 - 51.0 %   MCV 91 79 - 97 fL   MCH 30.8 26.6 - 33.0 pg   MCHC 34.0 31.5 - 35.7 g/dL   RDW 13.3 12.3 - 15.4 %   Platelets 262 150 - 379 x10E3/uL   Neutrophils 64 Not Estab. %   Lymphs 23 Not Estab. %   Monocytes 10 Not Estab. %   Eos 2 Not Estab. %   Basos 1 Not Estab. %   Neutrophils Absolute 4.0 1.4 - 7.0 x10E3/uL   Lymphocytes Absolute 1.5 0.7 - 3.1 x10E3/uL   Monocytes Absolute 0.6 0.1 - 0.9 x10E3/uL   EOS (ABSOLUTE) 0.1 0.0 - 0.4 x10E3/uL   Basophils Absolute 0.0 0.0 - 0.2 x10E3/uL   Immature Granulocytes 0 Not Estab. %   Immature Grans (Abs) 0.0 0.0 - 0.1 x10E3/uL  Comprehensive metabolic panel  Result Value Ref Range   Glucose 84 65 - 99 mg/dL   BUN 8 6 - 24 mg/dL   Creatinine, Ser 0.88 0.76 - 1.27 mg/dL   GFR calc non Af Amer 102 >59 mL/min/1.73   GFR calc Af Amer 118 >59 mL/min/1.73   BUN/Creatinine Ratio 9 9 - 20   Sodium 142 134 - 144 mmol/L   Potassium 4.3 3.5 - 5.2 mmol/L   Chloride 104 96 - 106 mmol/L   CO2 23 20 - 29 mmol/L   Calcium 9.4 8.7 - 10.2 mg/dL  Total Protein 6.7 6.0 - 8.5 g/dL   Albumin 4.5 3.5 - 5.5 g/dL   Globulin,  Total 2.2 1.5 - 4.5 g/dL   Albumin/Globulin Ratio 2.0 1.2 - 2.2   Bilirubin Total 0.7 0.0 - 1.2 mg/dL   Alkaline Phosphatase 56 39 - 117 IU/L   AST 20 0 - 40 IU/L   ALT 23 0 - 44 IU/L  Amylase  Result Value Ref Range   Amylase 71 31 - 124 U/L  Lipase  Result Value Ref Range   Lipase 22 13 - 78 U/L      Assessment & Plan:   Problem List Items Addressed This Visit      Cardiovascular and Mediastinum   Essential hypertension    BPs above goal fairly consistently the past year or so, will add low dose lisinopril and monitor closely for benefit. Risks and side effects reviewed. DASH diet, exercise encouraged. Recheck in 2 weeks      Relevant Medications   lisinopril (PRINIVIL,ZESTRIL) 10 MG tablet   atorvastatin (LIPITOR) 40 MG tablet     Other   Hyperlipidemia - Primary    Recheck lipids, continue current regimen. Lifestyle modifications encouraged      Relevant Medications   lisinopril (PRINIVIL,ZESTRIL) 10 MG tablet   atorvastatin (LIPITOR) 40 MG tablet   Other Relevant Orders   Lipid Panel w/o Chol/HDL Ratio   Comprehensive metabolic panel       Follow up plan: Return in about 2 weeks (around 04/15/2018) for BP.

## 2018-04-01 NOTE — Assessment & Plan Note (Addendum)
BPs above goal fairly consistently the past year or so, will add low dose lisinopril and monitor closely for benefit. Risks and side effects reviewed. DASH diet, exercise encouraged. Recheck in 2 weeks

## 2018-04-02 LAB — COMPREHENSIVE METABOLIC PANEL
ALBUMIN: 4.4 g/dL (ref 3.5–5.5)
ALK PHOS: 62 IU/L (ref 39–117)
ALT: 23 IU/L (ref 0–44)
AST: 15 IU/L (ref 0–40)
Albumin/Globulin Ratio: 2.2 (ref 1.2–2.2)
BUN/Creatinine Ratio: 11 (ref 9–20)
BUN: 10 mg/dL (ref 6–24)
Bilirubin Total: 0.8 mg/dL (ref 0.0–1.2)
CO2: 23 mmol/L (ref 20–29)
CREATININE: 0.92 mg/dL (ref 0.76–1.27)
Calcium: 9.1 mg/dL (ref 8.7–10.2)
Chloride: 101 mmol/L (ref 96–106)
GFR calc Af Amer: 113 mL/min/{1.73_m2} (ref >59)
GFR calc non Af Amer: 98 mL/min/{1.73_m2} (ref >59)
GLUCOSE: 100 mg/dL — AB (ref 65–99)
Globulin, Total: 2 g/dL (ref 1.5–4.5)
Potassium: 4 mmol/L (ref 3.5–5.2)
Sodium: 139 mmol/L (ref 134–144)
Total Protein: 6.4 g/dL (ref 6.0–8.5)

## 2018-04-02 LAB — LIPID PANEL W/O CHOL/HDL RATIO
Cholesterol, Total: 148 mg/dL (ref 100–199)
HDL: 57 mg/dL (ref >39)
LDL Calculated: 62 mg/dL (ref 0–99)
Triglycerides: 145 mg/dL (ref 0–149)
VLDL Cholesterol Cal: 29 mg/dL (ref 5–40)

## 2018-04-15 ENCOUNTER — Encounter: Payer: Self-pay | Admitting: Family Medicine

## 2018-04-15 ENCOUNTER — Ambulatory Visit (INDEPENDENT_AMBULATORY_CARE_PROVIDER_SITE_OTHER): Admitting: Family Medicine

## 2018-04-15 VITALS — BP 122/74 | HR 64 | Temp 98.7°F | Ht 68.5 in | Wt 214.5 lb

## 2018-04-15 DIAGNOSIS — I10 Essential (primary) hypertension: Secondary | ICD-10-CM

## 2018-04-15 MED ORDER — LISINOPRIL 10 MG PO TABS: 10.0000 mg | ORAL_TABLET | Freq: Every day | ORAL | 1 refills | Status: DC

## 2018-04-15 NOTE — Assessment & Plan Note (Signed)
BPs improved but on the low side. Will continue to monitor at home and stay well hydrated. May need to reduce to 5 mg. Will have him call if readings remain low or if becoming symptomatic.

## 2018-04-15 NOTE — Progress Notes (Signed)
BP 122/74   Pulse 64   Temp 98.7 F (37.1 C) (Oral)   Ht 5' 8.5" (1.74 m)   Wt 214 lb 8 oz (97.3 kg)   SpO2 96%   BMI 32.14 kg/m    Subjective:    Patient ID: Jose Mosley, male    DOB: [birth year], 47 y.o.   MRN: [id number]  HPI: Jose Mosley is a 47 y.o. male  Chief Complaint  Patient presents with  . Hypertension   Here today for BP f/u after starting 10 mg lisinopril. Home BPs running Low 100s/50s-60s. Feeling well, no dizziness, SOB, fatigue, CP, side effects. Working on [employer], exercise.   Relevant past medical, surgical, family and social history reviewed and updated as indicated. Interim medical history since our last visit reviewed. Allergies and medications reviewed and updated.  Review of Systems  Per HPI unless specifically indicated above     Objective:    BP 122/74   Pulse 64   Temp 98.7 F (37.1 C) (Oral)   Ht 5' 8.5" (1.74 m)   Wt 214 lb 8 oz (97.3 kg)   SpO2 96%   BMI 32.14 kg/m   Wt Readings from Last 3 Encounters:  04/15/18 214 lb 8 oz (97.3 kg)  04/01/18 212 lb 1.6 oz (96.2 kg)  09/17/17 206 lb 9.6 oz (93.7 kg)    Physical Exam Vitals signs and nursing note reviewed.  Constitutional:      Appearance: Normal appearance.  HENT:     Head: Atraumatic.  Eyes:     Extraocular Movements: Extraocular movements intact.     Conjunctiva/sclera: Conjunctivae normal.  Neck:     Musculoskeletal: Normal range of motion and neck supple.  Cardiovascular:     Rate and Rhythm: Normal rate and regular rhythm.  Pulmonary:     Effort: Pulmonary effort is normal.     Breath sounds: Normal breath sounds.  Musculoskeletal: Normal range of motion.  Skin:    General: Skin is warm and dry.  Neurological:     General: No focal deficit present.     Mental Status: He is oriented to person, place, and time.  Psychiatric:        Mood and Affect: Mood normal.        Thought Content: Thought content normal.        Judgment: Judgment normal.      Results for orders placed or performed in visit on 04/01/18  Lipid Panel w/o Chol/HDL Ratio  Result Value Ref Range   Cholesterol, Total 148 100 - 199 mg/dL   Triglycerides 145 0 - 149 mg/dL   HDL 57 >39 mg/dL   VLDL Cholesterol Cal 29 5 - 40 mg/dL   LDL Calculated 62 0 - 99 mg/dL  Comprehensive metabolic panel  Result Value Ref Range   Glucose 100 (H) 65 - 99 mg/dL   BUN 10 6 - 24 mg/dL   Creatinine, Ser 0.92 0.76 - 1.27 mg/dL   GFR calc non Af Amer 98 >59 mL/min/1.73   GFR calc Af Amer 113 >59 mL/min/1.73   BUN/Creatinine Ratio 11 9 - 20   Sodium 139 134 - 144 mmol/L   Potassium 4.0 3.5 - 5.2 mmol/L   Chloride 101 96 - 106 mmol/L   CO2 23 20 - 29 mmol/L   Calcium 9.1 8.7 - 10.2 mg/dL   Total Protein 6.4 6.0 - 8.5 g/dL   Albumin 4.4 3.5 - 5.5 g/dL   Globulin, Total 2.0 1.5 - 4.5  g/dL   Albumin/Globulin Ratio 2.2 1.2 - 2.2   Bilirubin Total 0.8 0.0 - 1.2 mg/dL   Alkaline Phosphatase 62 39 - 117 IU/L   AST 15 0 - 40 IU/L   ALT 23 0 - 44 IU/L      Assessment & Plan:   Problem List Items Addressed This Visit      Cardiovascular and Mediastinum   Essential hypertension - Primary    BPs improved but on the low side. Will continue to monitor at home and stay well hydrated. May need to reduce to 5 mg. Will have him call if readings remain low or if becoming symptomatic.       Relevant Medications   lisinopril (PRINIVIL,ZESTRIL) 10 MG tablet   Other Relevant Orders   Basic Metabolic Panel (BMET)       Follow up plan: Return in about 6 months (around 10/15/2018) for BP.

## 2018-04-16 LAB — BASIC METABOLIC PANEL
BUN/Creatinine Ratio: 10 (ref 9–20)
BUN: 8 mg/dL (ref 6–24)
CO2: 21 mmol/L (ref 20–29)
Calcium: 8.9 mg/dL (ref 8.7–10.2)
Chloride: 106 mmol/L (ref 96–106)
Creatinine, Ser: 0.83 mg/dL (ref 0.76–1.27)
GFR calc Af Amer: 120 mL/min/{1.73_m2} (ref >59)
GFR, EST NON AFRICAN AMERICAN: 104 mL/min/{1.73_m2} (ref >59)
Glucose: 90 mg/dL (ref 65–99)
Potassium: 4.3 mmol/L (ref 3.5–5.2)
Sodium: 140 mmol/L (ref 134–144)

## 2018-07-01 ENCOUNTER — Ambulatory Visit: Payer: Self-pay

## 2018-07-04 ENCOUNTER — Ambulatory Visit

## 2018-07-04 DIAGNOSIS — I10 Essential (primary) hypertension: Secondary | ICD-10-CM

## 2018-07-04 NOTE — Progress Notes (Signed)
Error

## 2018-10-18 ENCOUNTER — Ambulatory Visit: Admitting: Family Medicine

## 2018-10-19 ENCOUNTER — Encounter: Payer: Self-pay | Admitting: Family Medicine

## 2018-10-19 ENCOUNTER — Ambulatory Visit (INDEPENDENT_AMBULATORY_CARE_PROVIDER_SITE_OTHER): Admitting: Family Medicine

## 2018-10-19 ENCOUNTER — Other Ambulatory Visit: Payer: Self-pay

## 2018-10-19 VITALS — BP 128/74 | HR 65 | Temp 98.5°F | Ht 68.0 in | Wt 192.0 lb

## 2018-10-19 DIAGNOSIS — I10 Essential (primary) hypertension: Secondary | ICD-10-CM | POA: Diagnosis not present

## 2018-10-19 DIAGNOSIS — E7800 Pure hypercholesterolemia, unspecified: Secondary | ICD-10-CM

## 2018-10-19 NOTE — Progress Notes (Signed)
BP 128/74   Pulse 65   Temp 98.5 F (36.9 C) (Oral)   Ht 5\' 8"  (1.727 m)   Wt 192 lb (87.1 kg)   SpO2 98%   BMI 29.19 kg/m    Subjective:    Patient ID: Jose Mosley, male    DOB: 1970-08-10, 48 y.o.   MRN: [id number]  HPI: Jose Mosley is a 48 y.o. male  Chief Complaint  Patient presents with  . Hypertension    lisinopril refill  . Hyperlipidemia    atorvastatin refill   Here today for 6 month f/u chronic conditions. No new concerns today.   HTN - home BPs 120s/70s. Taking lisinopril daily without side effects. Denies CP, SOB, HAs, dizziness.   HLD - Taking lipitor daily, no side effects. Denies claudication, myalgias. Eats well and exercises regularly.   Relevant past medical, surgical, family and social history reviewed and updated as indicated. Interim medical history since our last visit reviewed. Allergies and medications reviewed and updated.  Review of Systems  Per HPI unless specifically indicated above     Objective:    BP 128/74   Pulse 65   Temp 98.5 F (36.9 C) (Oral)   Ht 5\' 8"  (1.727 m)   Wt 192 lb (87.1 kg)   SpO2 98%   BMI 29.19 kg/m   Wt Readings from Last 3 Encounters:  10/19/18 192 lb (87.1 kg)  04/15/18 214 lb 8 oz (97.3 kg)  04/01/18 212 lb 1.6 oz (96.2 kg)    Physical Exam Vitals signs and nursing note reviewed.  Constitutional:      Appearance: Normal appearance.  HENT:     Head: Atraumatic.  Eyes:     Extraocular Movements: Extraocular movements intact.     Conjunctiva/sclera: Conjunctivae normal.  Neck:     Musculoskeletal: Normal range of motion and neck supple.  Cardiovascular:     Rate and Rhythm: Normal rate and regular rhythm.  Pulmonary:     Effort: Pulmonary effort is normal.     Breath sounds: Normal breath sounds.  Musculoskeletal: Normal range of motion.  Skin:    General: Skin is warm and dry.  Neurological:     General: No focal deficit present.     Mental Status: He is oriented to person,  place, and time.  Psychiatric:        Mood and Affect: Mood normal.        Thought Content: Thought content normal.        Judgment: Judgment normal.     Results for orders placed or performed in visit on 10/19/18  Lipid Panel w/o Chol/HDL Ratio  Result Value Ref Range   Cholesterol, Total 182 100 - 199 mg/dL   Triglycerides 55 0 - 149 mg/dL   HDL 70 >39 mg/dL   VLDL Cholesterol Cal 11 5 - 40 mg/dL   LDL Calculated 101 (H) 0 - 99 mg/dL  Comprehensive metabolic panel  Result Value Ref Range   Glucose 81 65 - 99 mg/dL   BUN 13 6 - 24 mg/dL   Creatinine, Ser 0.94 0.76 - 1.27 mg/dL   GFR calc non Af Amer 95 >59 mL/min/1.73   GFR calc Af Amer 110 >59 mL/min/1.73   BUN/Creatinine Ratio 14 9 - 20   Sodium 137 134 - 144 mmol/L   Potassium 4.4 3.5 - 5.2 mmol/L   Chloride 102 96 - 106 mmol/L   CO2 24 20 - 29 mmol/L   Calcium 9.5 8.7 - 10.2  mg/dL   Total Protein 6.4 6.0 - 8.5 g/dL   Albumin 4.7 4.0 - 5.0 g/dL   Globulin, Total 1.7 1.5 - 4.5 g/dL   Albumin/Globulin Ratio 2.8 (H) 1.2 - 2.2   Bilirubin Total 0.9 0.0 - 1.2 mg/dL   Alkaline Phosphatase 57 39 - 117 IU/L   AST 25 0 - 40 IU/L   ALT 20 0 - 44 IU/L      Assessment & Plan:   Problem List Items Addressed This Visit      Cardiovascular and Mediastinum   Essential hypertension - Primary    BPs stable and WNL, continue current regimen      Relevant Orders   Comprehensive metabolic panel (Completed)     Other   Hyperlipidemia    Recheck lipids, adjust as needed. Continue current regimen      Relevant Orders   Lipid Panel w/o Chol/HDL Ratio (Completed)       Follow up plan: Return in about 6 months (around 04/20/2019) for CPE.

## 2018-10-20 ENCOUNTER — Telehealth: Payer: Self-pay | Admitting: Family Medicine

## 2018-10-20 LAB — COMPREHENSIVE METABOLIC PANEL
ALT: 20 IU/L (ref 0–44)
AST: 25 IU/L (ref 0–40)
Albumin/Globulin Ratio: 2.8 — ABNORMAL HIGH (ref 1.2–2.2)
Albumin: 4.7 g/dL (ref 4.0–5.0)
Alkaline Phosphatase: 57 IU/L (ref 39–117)
BUN/Creatinine Ratio: 14 (ref 9–20)
BUN: 13 mg/dL (ref 6–24)
Bilirubin Total: 0.9 mg/dL (ref 0.0–1.2)
CO2: 24 mmol/L (ref 20–29)
Calcium: 9.5 mg/dL (ref 8.7–10.2)
Chloride: 102 mmol/L (ref 96–106)
Creatinine, Ser: 0.94 mg/dL (ref 0.76–1.27)
GFR calc Af Amer: 110 mL/min/{1.73_m2} (ref >59)
GFR calc non Af Amer: 95 mL/min/{1.73_m2} (ref >59)
Globulin, Total: 1.7 g/dL (ref 1.5–4.5)
Glucose: 81 mg/dL (ref 65–99)
Potassium: 4.4 mmol/L (ref 3.5–5.2)
Sodium: 137 mmol/L (ref 134–144)
Total Protein: 6.4 g/dL (ref 6.0–8.5)

## 2018-10-20 LAB — LIPID PANEL W/O CHOL/HDL RATIO
Cholesterol, Total: 182 mg/dL (ref 100–199)
HDL: 70 mg/dL (ref >39)
LDL Calculated: 101 mg/dL — ABNORMAL HIGH (ref 0–99)
Triglycerides: 55 mg/dL (ref 0–149)
VLDL Cholesterol Cal: 11 mg/dL (ref 5–40)

## 2018-10-20 MED ORDER — ATORVASTATIN CALCIUM 40 MG PO TABS: ORAL_TABLET | ORAL | 1 refills | Status: DC

## 2018-10-20 MED ORDER — LISINOPRIL 10 MG PO TABS: 10.0000 mg | ORAL_TABLET | Freq: Every day | ORAL | 1 refills | Status: DC

## 2018-10-20 NOTE — Telephone Encounter (Signed)
Message relayed to patient. Verbalized understanding and denied questions.   

## 2018-10-20 NOTE — Telephone Encounter (Signed)
Pt called to check the status of the refills for the medications below. Pt seen Merrie Roof yesterday / please advise    atorvastatin (LIPITOR) 40 MG tablet lisinopril (PRINIVIL,ZESTRIL) 10 MG tablet       [facility] [address], [place] [phone] (Phone) [phone] (Fax)

## 2018-10-20 NOTE — Telephone Encounter (Signed)
Got them sent over

## 2018-10-22 NOTE — Assessment & Plan Note (Signed)
Recheck lipids, adjust as needed. Continue current regimen 

## 2018-10-22 NOTE — Assessment & Plan Note (Signed)
BPs stable and WNL, continue current regimen 

## 2018-11-08 IMAGING — DX DG CHEST 2V
2 series · 2 of 2 positions shown · non-contrast
Comparison: None.

CLINICAL DATA: Pt c/o pain in the lower thorax region on the right
side that radiates through to his back for a couple of months.

Hx of high BP, no respiratory issues or complaints
EXAM:
CHEST - 2 VIEW

[chest pa]
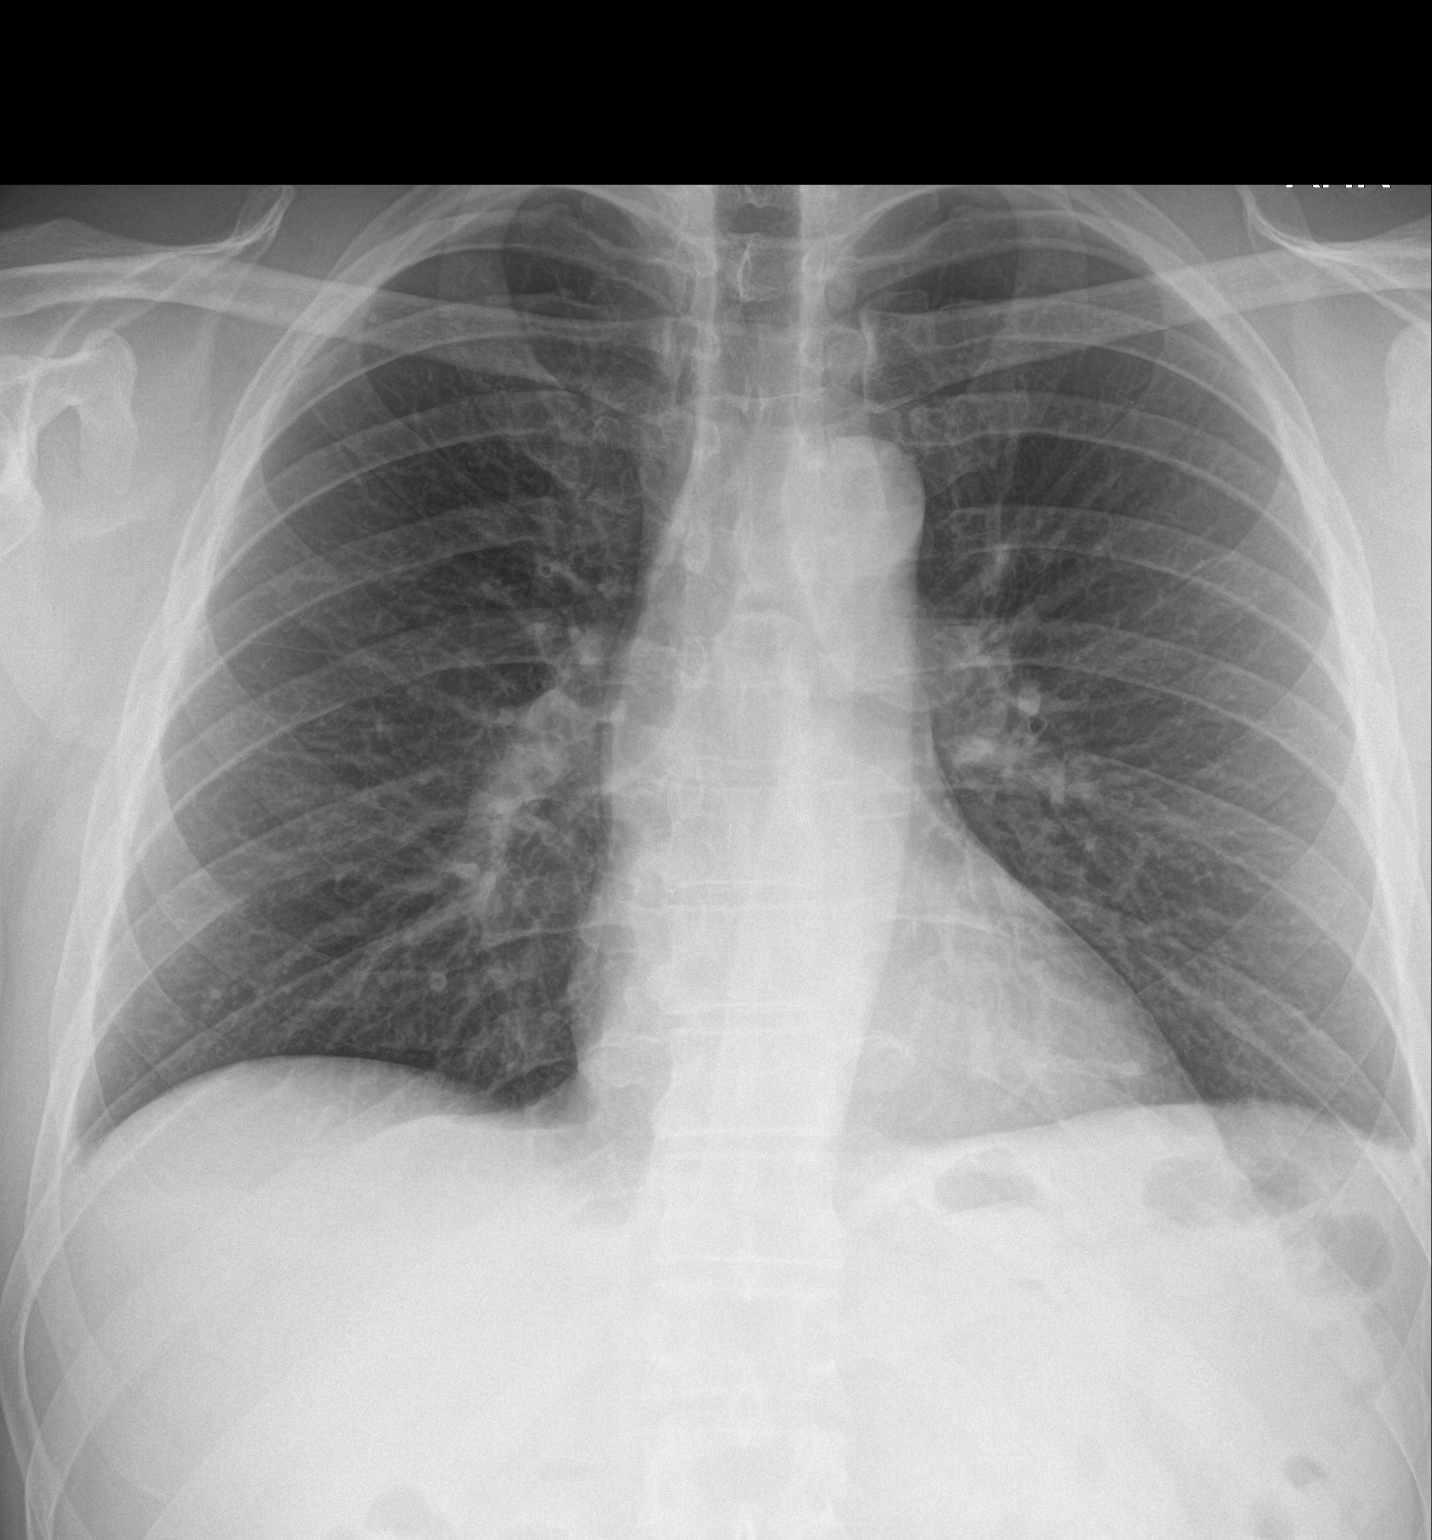

[chest lat]
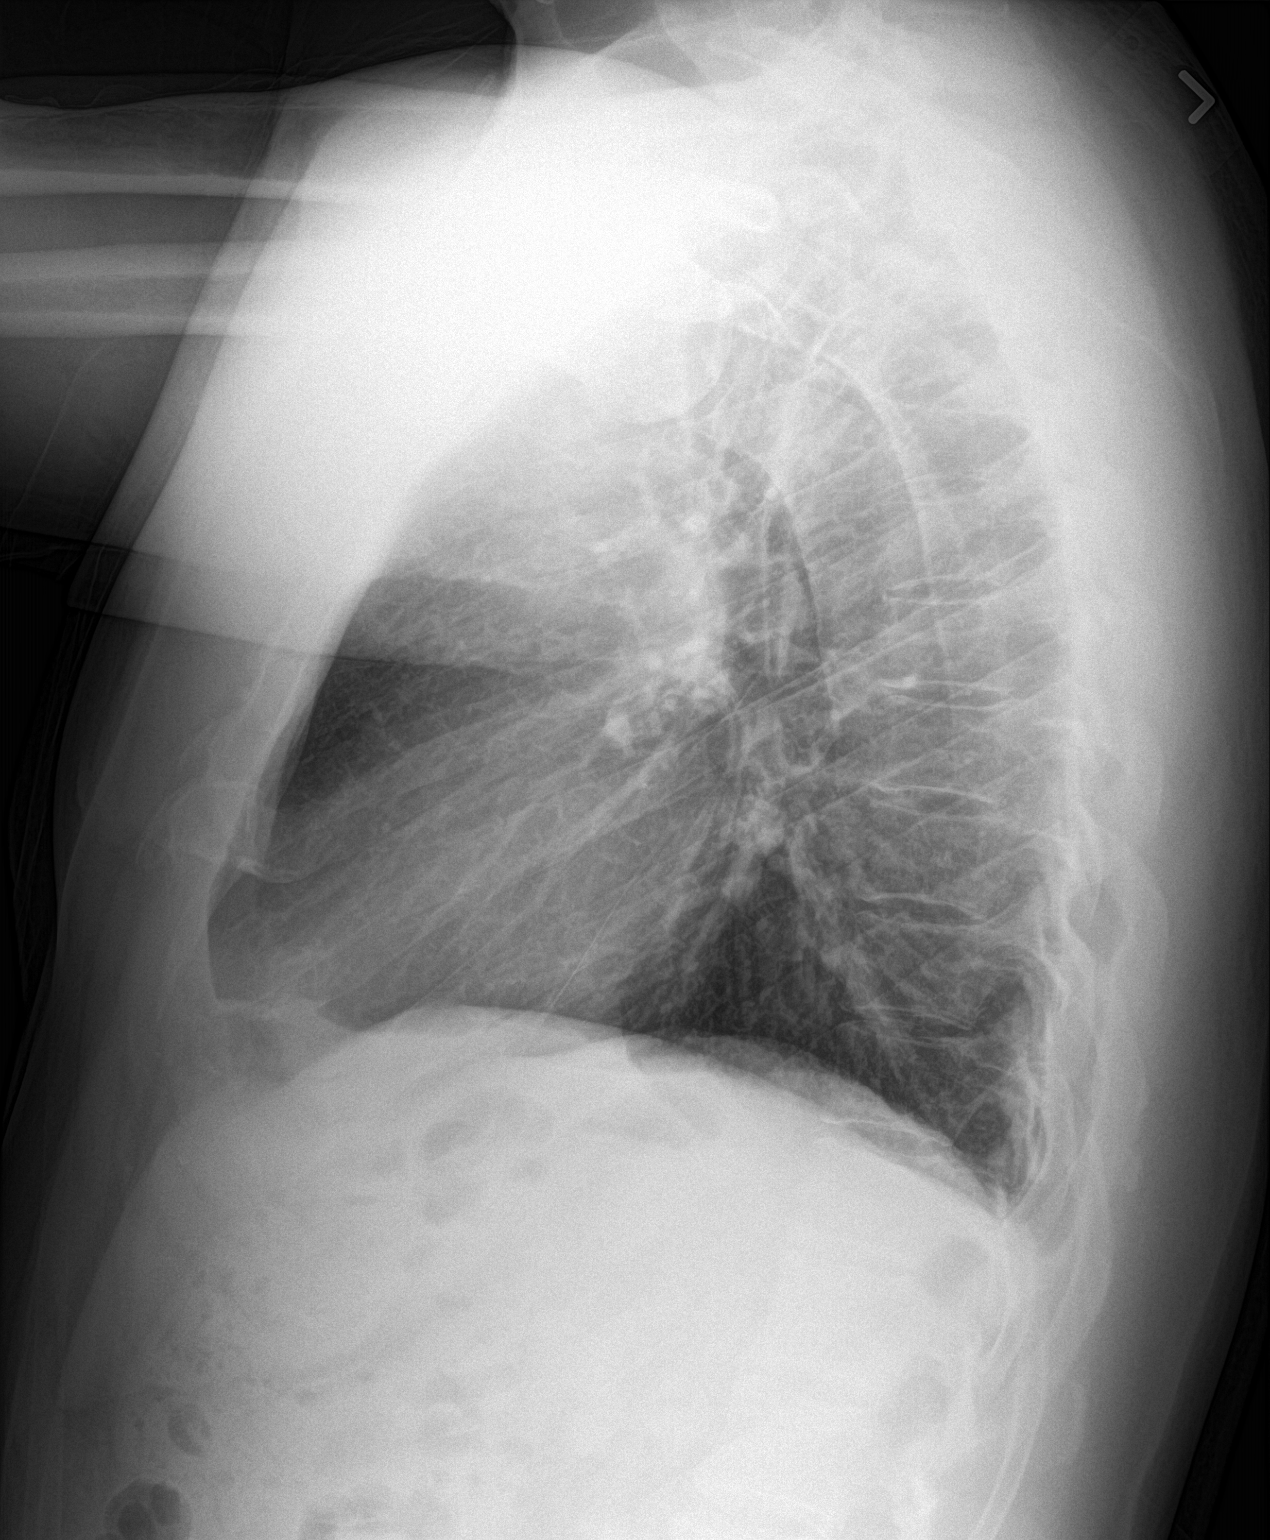

[2 of 2 positions shown; findings below may reference images not displayed]

FINDINGS: The heart size and mediastinal contours are within normal limits.
Both lungs are clear. No pleural effusion or pneumothorax. The
visualized skeletal structures are unremarkable.
IMPRESSION: No active cardiopulmonary disease.

## 2018-11-28 IMAGING — US US ABDOMEN COMPLETE
1 series · 14 of 25 positions shown · non-contrast
Comparison: None.

CLINICAL DATA: Right upper quadrant pain

EXAM:
ABDOMEN ULTRASOUND COMPLETE

[Series 1: us abdomen complete · 0.20mm/px · 14 of 85 slices shown]
[im 1/85]
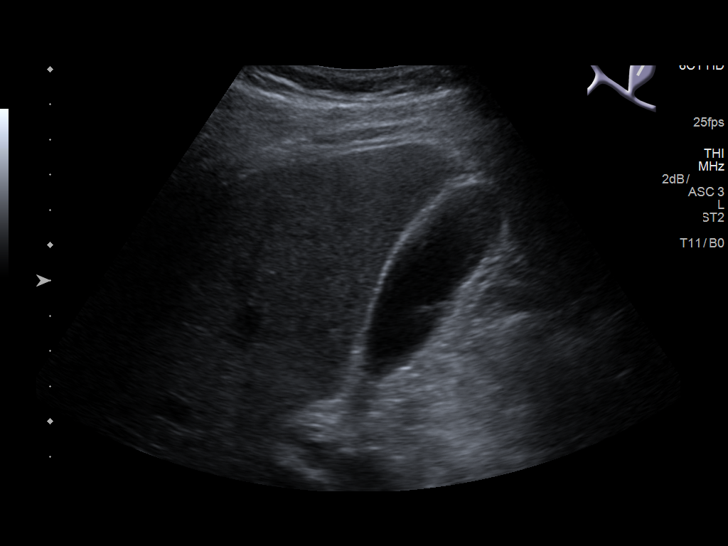
[im 8/85]
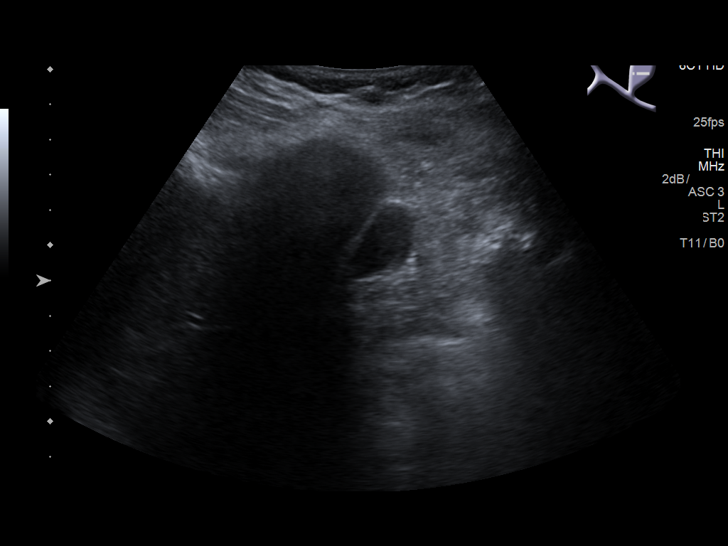
[im 15/85]
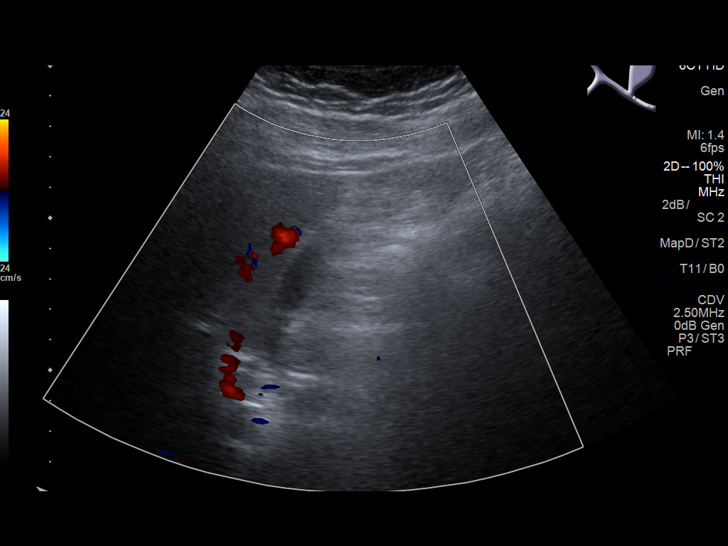
[im 22/85]
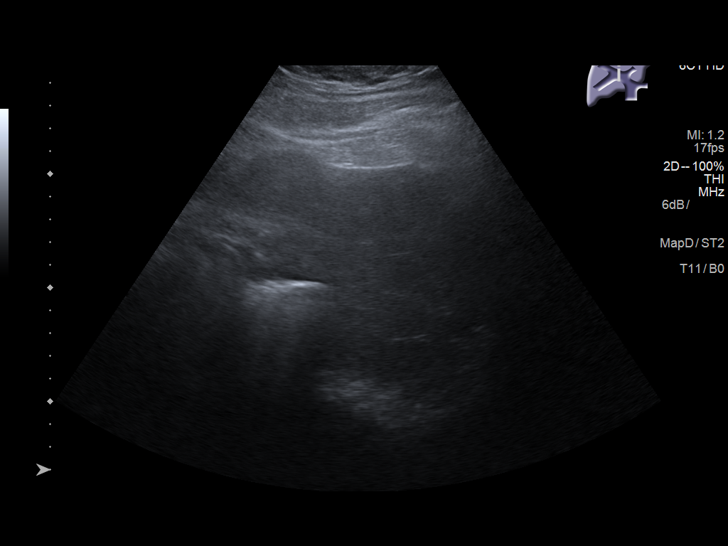
[im 29/85]
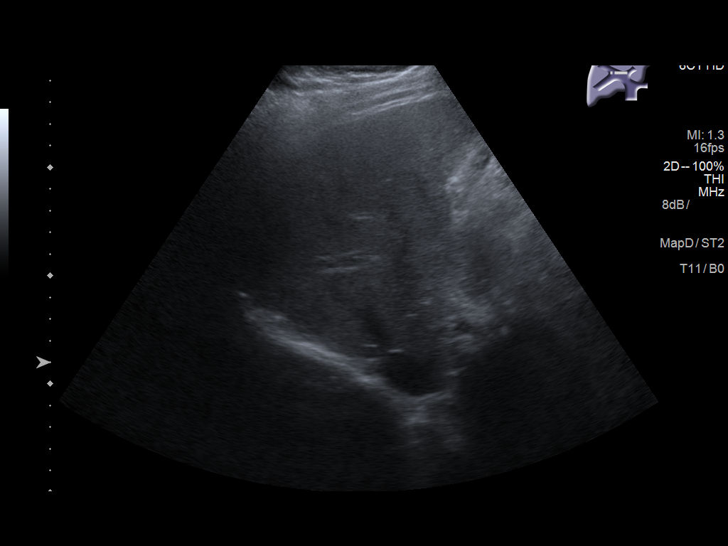
[im 32/85]
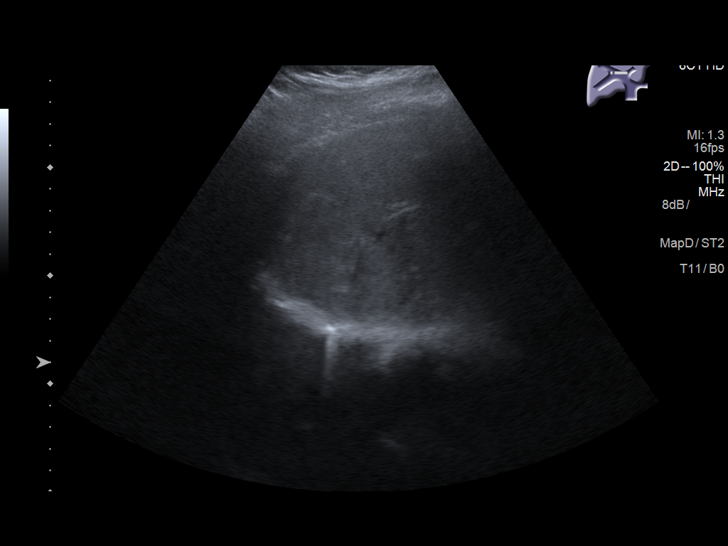
[im 39/85]
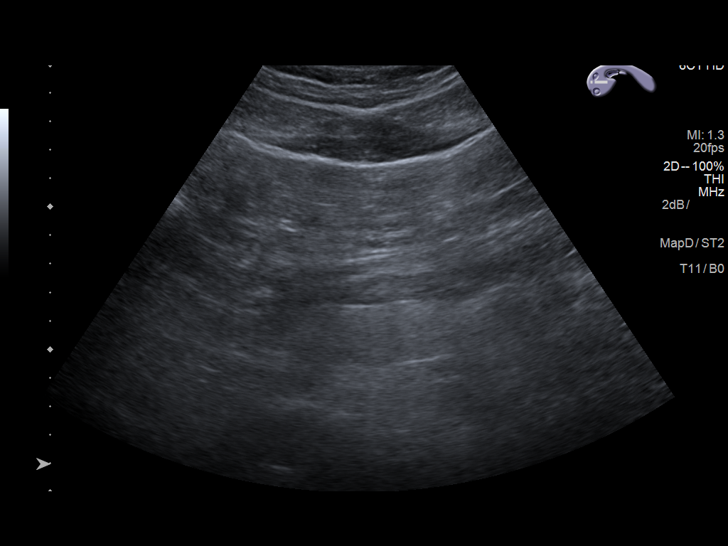
[im 46/85]
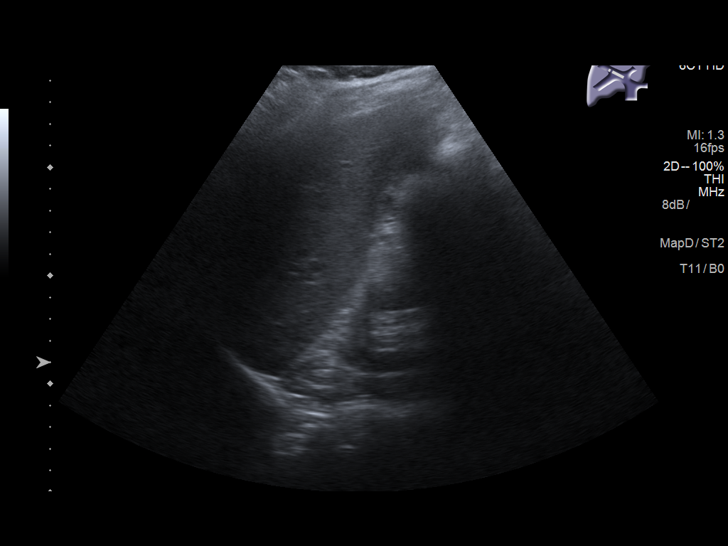
[im 53/85]
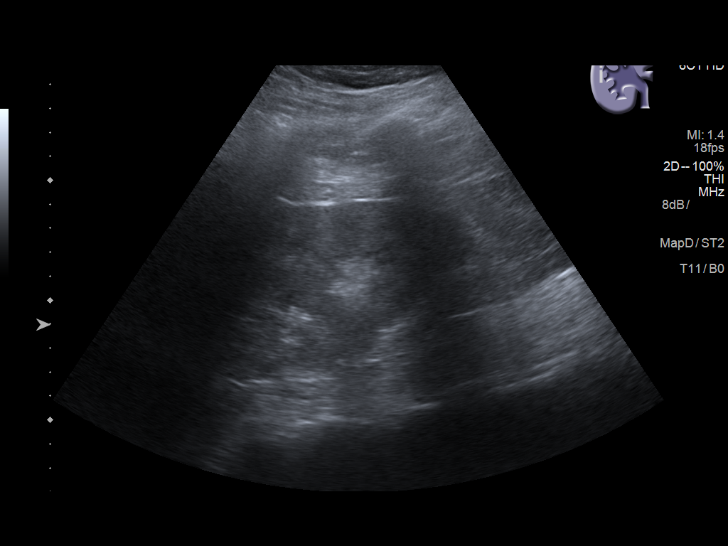
[im 57/85]
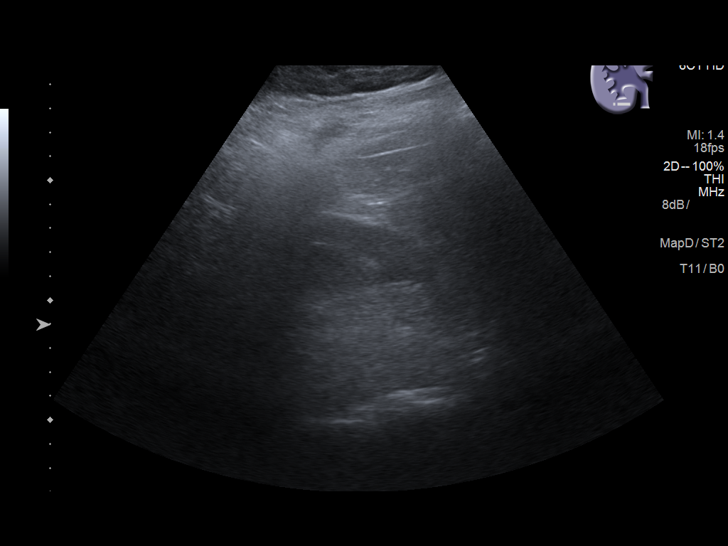
[im 64/85]
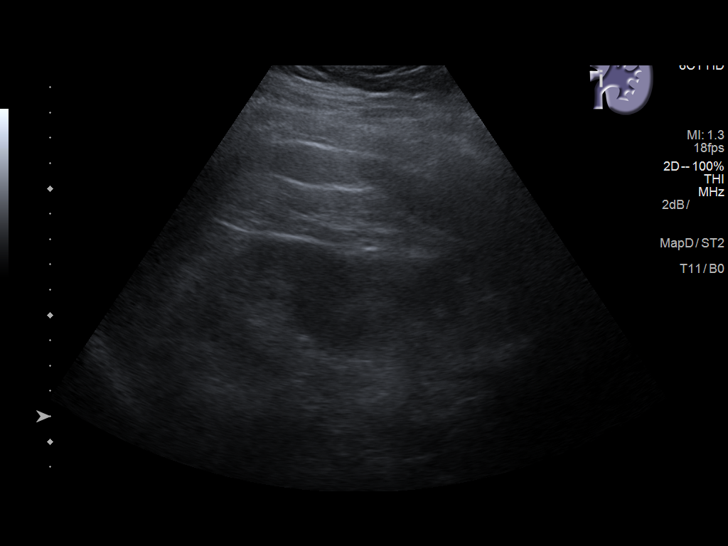
[im 71/85]
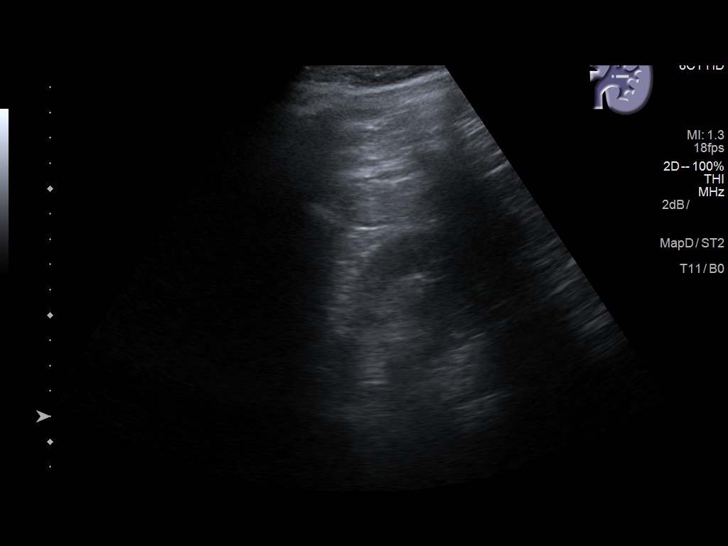
[im 78/85]
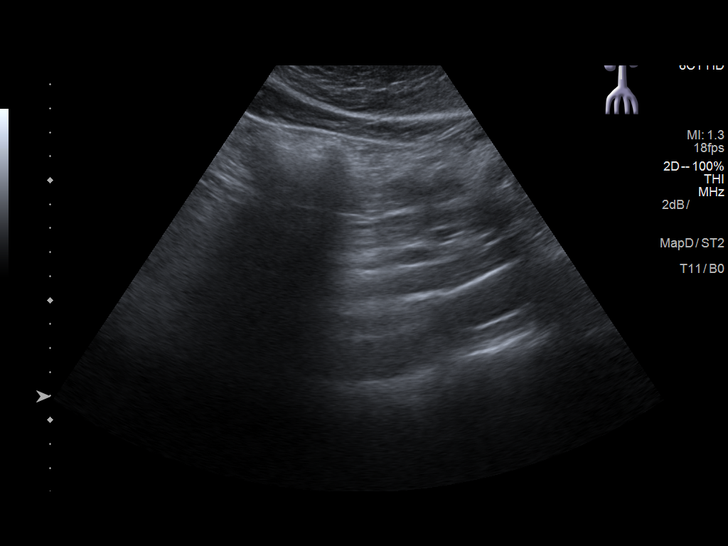
[im 85/85]
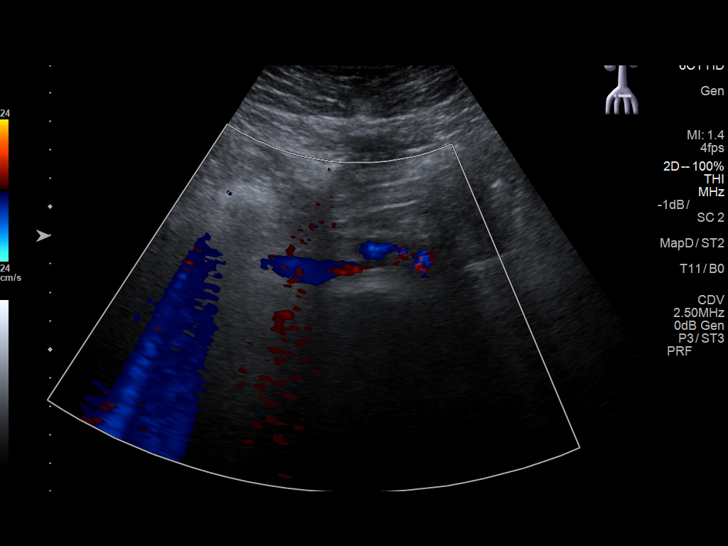

[14 of 25 positions shown; findings below may reference images not displayed]

FINDINGS: Gallbladder: No gallstones or wall thickening visualized. No
sonographic Murphy sign noted by sonographer.

Common bile duct: Diameter: 4.3 mm.

Liver: No focal lesion identified. Within normal limits in
parenchymal echogenicity. Portal vein is patent on color Doppler
imaging with normal direction of blood flow towards the liver.

IVC: No abnormality visualized.

Pancreas: Visualized portion unremarkable.

Spleen: Size and appearance within normal limits.

Right Kidney: Length: 11.8 cm.. Echogenicity within normal limits.
No mass or hydronephrosis visualized.

Left Kidney: Length: 14.2 cm.. Faint echogenicity is noted in the
midportion of left kidney which may represent a mildly calcified
stone. No obstructive changes are noted.

Abdominal aorta: No aneurysm visualized.

Other findings: None.
IMPRESSION: Question left renal stone without complicating factors. No other
focal abnormality is noted.

## 2019-04-05 ENCOUNTER — Ambulatory Visit (INDEPENDENT_AMBULATORY_CARE_PROVIDER_SITE_OTHER): Admitting: Family Medicine

## 2019-04-05 ENCOUNTER — Telehealth: Payer: Self-pay | Admitting: Family Medicine

## 2019-04-05 ENCOUNTER — Other Ambulatory Visit: Payer: Self-pay

## 2019-04-05 ENCOUNTER — Encounter: Payer: Self-pay | Admitting: Family Medicine

## 2019-04-05 VITALS — BP 116/64 | HR 59 | Temp 97.9°F | Ht 68.0 in | Wt 173.0 lb

## 2019-04-05 DIAGNOSIS — E7800 Pure hypercholesterolemia, unspecified: Secondary | ICD-10-CM | POA: Diagnosis not present

## 2019-04-05 DIAGNOSIS — I10 Essential (primary) hypertension: Secondary | ICD-10-CM | POA: Diagnosis not present

## 2019-04-05 DIAGNOSIS — Z833 Family history of diabetes mellitus: Secondary | ICD-10-CM

## 2019-04-05 MED ORDER — ROSUVASTATIN CALCIUM 20 MG PO TABS: 20.0000 mg | ORAL_TABLET | Freq: Every day | ORAL | 1 refills | Status: DC

## 2019-04-05 MED ORDER — LISINOPRIL 10 MG PO TABS: 10.0000 mg | ORAL_TABLET | Freq: Every day | ORAL | 1 refills | Status: DC

## 2019-04-05 NOTE — Progress Notes (Signed)
BP 116/64   Pulse (!) 59   Temp 97.9 F (36.6 C) (Oral)   Ht 5\' 8"  (1.727 m)   Wt 173 lb (78.5 kg)   BMI 26.30 kg/m    Subjective:    Patient ID: Jose Mosley, male    DOB: [birth year], 48 y.o.   MRN: [id number]  HPI: Jose Mosley is a 48 y.o. male  Chief Complaint  Patient presents with  . Hypertension  . Hyperlipidemia    patient states he woulld like to change Rx from atorvastatin to rosuvastatin  . Medication Refill    . This visit was completed via WebEx due to the restrictions of the COVID-19 pandemic. All issues as above were discussed and addressed. Physical exam was done as above through visual confirmation on WebEx. If it was felt that the patient should be evaluated in the office, they were directed there. The patient verbally consented to this visit. . Location of the patient: home . Location of the provider: work . Those involved with this call:  . Provider: Merrie Roof, PA-C . CMA: Lesle Chris, [place] . Front Desk/Registration: Jill Side  . Time spent on call: 15 minutes with patient face to face via video conference. More than 50% of this time was spent in counseling and coordination of care. 5 minutes total spent in review of patient's record and preparation of their chart. I verified patient identity using two factors (patient name and date of birth). Patient consents verbally to being seen via telemedicine visit today.   Presenting for 6 month f/u.   HTN - Home BPs have been running 90s/60s - 100/60s in the AM, not checking in PM. Has lost 40 lb since initiating the medicine last year using keto diet. Denies CP, SOB, dizziness, HAs, syncope.   HLD - Tolerating lipitor well, but wanting to try crestor instead due to some research he's done on it's benefits. Doing keto diet and trying to stay very active. Denies CP, SOB, claudication or myalgias.   No new concerns today.   Relevant past medical, surgical, family and social history reviewed and  updated as indicated. Interim medical history since our last visit reviewed. Allergies and medications reviewed and updated.  Review of Systems  Per HPI unless specifically indicated above     Objective:    BP 116/64   Pulse (!) 59   Temp 97.9 F (36.6 C) (Oral)   Ht 5\' 8"  (1.727 m)   Wt 173 lb (78.5 kg)   BMI 26.30 kg/m   Wt Readings from Last 3 Encounters:  04/05/19 173 lb (78.5 kg)  10/19/18 192 lb (87.1 kg)  04/15/18 214 lb 8 oz (97.3 kg)    Physical Exam Vitals signs and nursing note reviewed.  Constitutional:      General: He is not in acute distress.    Appearance: Normal appearance.  HENT:     Head: Atraumatic.     Right Ear: External ear normal.     Left Ear: External ear normal.     Nose: Nose normal. No congestion.     Mouth/Throat:     Mouth: Mucous membranes are moist.     Pharynx: Oropharynx is clear.  Eyes:     Extraocular Movements: Extraocular movements intact.     Conjunctiva/sclera: Conjunctivae normal.  Neck:     Musculoskeletal: Normal range of motion.  Pulmonary:     Effort: Pulmonary effort is normal. No respiratory distress.  Musculoskeletal: Normal range of motion.  Skin:  General: Skin is dry.     Findings: No erythema or rash.  Neurological:     Mental Status: He is oriented to person, place, and time.  Psychiatric:        Mood and Affect: Mood normal.        Thought Content: Thought content normal.        Judgment: Judgment normal.     Results for orders placed or performed in visit on 10/19/18  Lipid Panel w/o Chol/HDL Ratio  Result Value Ref Range   Cholesterol, Total 182 100 - 199 mg/dL   Triglycerides 55 0 - 149 mg/dL   HDL 70 >39 mg/dL   VLDL Cholesterol Cal 11 5 - 40 mg/dL   LDL Calculated 101 (H) 0 - 99 mg/dL  Comprehensive metabolic panel  Result Value Ref Range   Glucose 81 65 - 99 mg/dL   BUN 13 6 - 24 mg/dL   Creatinine, Ser 0.94 0.76 - 1.27 mg/dL   GFR calc non Af Amer 95 >59 mL/min/1.73   GFR calc Af Amer  110 >59 mL/min/1.73   BUN/Creatinine Ratio 14 9 - 20   Sodium 137 134 - 144 mmol/L   Potassium 4.4 3.5 - 5.2 mmol/L   Chloride 102 96 - 106 mmol/L   CO2 24 20 - 29 mmol/L   Calcium 9.5 8.7 - 10.2 mg/dL   Total Protein 6.4 6.0 - 8.5 g/dL   Albumin 4.7 4.0 - 5.0 g/dL   Globulin, Total 1.7 1.5 - 4.5 g/dL   Albumin/Globulin Ratio 2.8 (H) 1.2 - 2.2   Bilirubin Total 0.9 0.0 - 1.2 mg/dL   Alkaline Phosphatase 57 39 - 117 IU/L   AST 25 0 - 40 IU/L   ALT 20 0 - 44 IU/L      Assessment & Plan:   Problem List Items Addressed This Visit      Cardiovascular and Mediastinum   Essential hypertension    BPs running on the low end likely due to weight loss/lifestyle habits. Has not had any dizzy spells. Will have him start checking evening BPs and if also having low - low normal readings in evenings will decrease dose to 5 mg on lisinopril. Call if having hypotensive sxs      Relevant Medications   rosuvastatin (CRESTOR) 20 MG tablet   lisinopril (ZESTRIL) 10 MG tablet   Other Relevant Orders   Comprehensive metabolic panel     Other   Hyperlipidemia - Primary    D/c lipitor, start crestor per patient preference. Await lipid recheck, continue good diet and exercise habits      Relevant Medications   rosuvastatin (CRESTOR) 20 MG tablet   lisinopril (ZESTRIL) 10 MG tablet   Other Relevant Orders   Lipid Panel w/o Chol/HDL Ratio out       Follow up plan: Return in about 6 months (around 10/04/2019) for CPE.

## 2019-04-05 NOTE — Telephone Encounter (Signed)
A1C added.   Copied from [place] [phone] [phone]. Topic: General - Other >> Apr 05, 2019 10:46 AM Lennox Solders wrote: Reason for CRM:pt will have lipid check tomorrow and would like to have a1c check also. Pt is not dm he never had A1c .

## 2019-04-05 NOTE — Assessment & Plan Note (Signed)
BPs running on the low end likely due to weight loss/lifestyle habits. Has not had any dizzy spells. Will have him start checking evening BPs and if also having low - low normal readings in evenings will decrease dose to 5 mg on lisinopril. Call if having hypotensive sxs

## 2019-04-05 NOTE — Assessment & Plan Note (Signed)
D/c lipitor, start crestor per patient preference. Await lipid recheck, continue good diet and exercise habits

## 2019-04-06 ENCOUNTER — Other Ambulatory Visit: Payer: Self-pay

## 2019-04-06 ENCOUNTER — Other Ambulatory Visit

## 2019-04-06 DIAGNOSIS — Z833 Family history of diabetes mellitus: Secondary | ICD-10-CM

## 2019-04-06 DIAGNOSIS — I10 Essential (primary) hypertension: Secondary | ICD-10-CM

## 2019-04-06 DIAGNOSIS — E7800 Pure hypercholesterolemia, unspecified: Secondary | ICD-10-CM

## 2019-04-07 LAB — COMPREHENSIVE METABOLIC PANEL
ALT: 20 IU/L (ref 0–44)
AST: 20 IU/L (ref 0–40)
Albumin/Globulin Ratio: 2.4 — ABNORMAL HIGH (ref 1.2–2.2)
Albumin: 4.6 g/dL (ref 4.0–5.0)
Alkaline Phosphatase: 60 IU/L (ref 39–117)
BUN/Creatinine Ratio: 12 (ref 9–20)
BUN: 12 mg/dL (ref 6–24)
Bilirubin Total: 0.8 mg/dL (ref 0.0–1.2)
CO2: 22 mmol/L (ref 20–29)
Calcium: 9.3 mg/dL (ref 8.7–10.2)
Chloride: 103 mmol/L (ref 96–106)
Creatinine, Ser: 0.99 mg/dL (ref 0.76–1.27)
GFR calc Af Amer: 103 mL/min/{1.73_m2} (ref >59)
GFR calc non Af Amer: 89 mL/min/{1.73_m2} (ref >59)
Globulin, Total: 1.9 g/dL (ref 1.5–4.5)
Glucose: 90 mg/dL (ref 65–99)
Potassium: 5.2 mmol/L (ref 3.5–5.2)
Sodium: 137 mmol/L (ref 134–144)
Total Protein: 6.5 g/dL (ref 6.0–8.5)

## 2019-04-07 LAB — HEMOGLOBIN A1C
Est. average glucose Bld gHb Est-mCnc: 100 mg/dL
Hgb A1c MFr Bld: 5.1 % (ref 4.8–5.6)

## 2019-04-07 LAB — LIPID PANEL W/O CHOL/HDL RATIO
Cholesterol, Total: 203 mg/dL — ABNORMAL HIGH (ref 100–199)
HDL: 81 mg/dL (ref >39)
LDL Chol Calc (NIH): 113 mg/dL — ABNORMAL HIGH (ref 0–99)
Triglycerides: 50 mg/dL (ref 0–149)
VLDL Cholesterol Cal: 9 mg/dL (ref 5–40)

## 2019-04-24 ENCOUNTER — Encounter: Payer: Self-pay | Admitting: Family Medicine

## 2019-04-24 ENCOUNTER — Telehealth: Payer: Self-pay | Admitting: Family Medicine

## 2019-04-24 NOTE — Telephone Encounter (Signed)
Called pt to schedule 6 month CPE, no answer left vm

## 2019-07-31 ENCOUNTER — Ambulatory Visit

## 2019-07-31 ENCOUNTER — Other Ambulatory Visit: Payer: Self-pay

## 2019-07-31 VITALS — BP 119/79 | HR 70

## 2019-07-31 DIAGNOSIS — Z0130 Encounter for examination of blood pressure without abnormal findings: Secondary | ICD-10-CM

## 2019-07-31 NOTE — Progress Notes (Signed)
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 1st of 3 checks.

## 2019-08-01 ENCOUNTER — Ambulatory Visit

## 2019-08-01 VITALS — BP 121/69 | HR 66

## 2019-08-01 DIAGNOSIS — Z0130 Encounter for examination of blood pressure without abnormal findings: Secondary | ICD-10-CM

## 2019-08-01 NOTE — Progress Notes (Signed)
Patient came in for second BP check this morning. Normal range BP documented.

## 2019-08-02 ENCOUNTER — Ambulatory Visit

## 2019-08-02 ENCOUNTER — Other Ambulatory Visit: Payer: Self-pay

## 2019-08-02 VITALS — BP 118/77 | HR 61

## 2019-08-02 DIAGNOSIS — Z0130 Encounter for examination of blood pressure without abnormal findings: Secondary | ICD-10-CM

## 2019-08-04 ENCOUNTER — Telehealth: Payer: Self-pay | Admitting: Family Medicine

## 2019-08-04 NOTE — Telephone Encounter (Signed)
Copied from [place] [id number]. Topic: General - Other >> Aug 04, 2019 12:39 PM Rayann Heman wrote: Reason for CRM: pt called and stated that he would like a call back from the office. Pt states that he needs a letter that states his las 3 BP readings. Please advise Pt would like to pick this letter up today.

## 2019-08-04 NOTE — Telephone Encounter (Signed)
Patient notified

## 2019-08-04 NOTE — Telephone Encounter (Signed)
Patient has come in 3 consecutive days for BP readings (all documented). Is it ok to type reading in a letter for the patient?

## 2019-08-04 NOTE — Telephone Encounter (Signed)
Pt called back in to follow up on letter request.    Please assist

## 2019-08-04 NOTE — Telephone Encounter (Signed)
Letter already signed it was in my folder. Should be up front

## 2019-10-16 ENCOUNTER — Other Ambulatory Visit: Payer: Self-pay | Admitting: Family Medicine

## 2019-10-25 ENCOUNTER — Encounter: Payer: Self-pay | Admitting: Family Medicine

## 2019-10-25 ENCOUNTER — Other Ambulatory Visit: Payer: Self-pay

## 2019-10-25 ENCOUNTER — Ambulatory Visit (INDEPENDENT_AMBULATORY_CARE_PROVIDER_SITE_OTHER): Admitting: Family Medicine

## 2019-10-25 VITALS — BP 122/73 | HR 61 | Temp 98.2°F | Wt 198.0 lb

## 2019-10-25 DIAGNOSIS — Z114 Encounter for screening for human immunodeficiency virus [HIV]: Secondary | ICD-10-CM | POA: Diagnosis not present

## 2019-10-25 DIAGNOSIS — Z1159 Encounter for screening for other viral diseases: Secondary | ICD-10-CM | POA: Diagnosis not present

## 2019-10-25 DIAGNOSIS — E7800 Pure hypercholesterolemia, unspecified: Secondary | ICD-10-CM

## 2019-10-25 DIAGNOSIS — I10 Essential (primary) hypertension: Secondary | ICD-10-CM

## 2019-10-25 NOTE — Progress Notes (Signed)
BP 122/73    Pulse 61    Temp 98.2 F (36.8 C) (Oral)    Wt 198 lb (89.8 kg)    SpO2 99%    BMI 30.11 kg/m    Subjective:    Patient ID: Jose Mosley, male    DOB: 1970/09/11, 49 y.o.   MRN: [id number]  HPI: Jamone Garrido is a 49 y.o. male  Chief Complaint  Patient presents with   Hypertension   Hyperlipidemia   Presenting today for 6 month f/u chronic conditions. Taking medications faithfully without side effects. Trying to eat well and stay active. Does not regularly check home BPs. Denies CP, SOB, HAs, dizziness, claudication, myalgias.   Relevant past medical, surgical, family and social history reviewed and updated as indicated. Interim medical history since our last visit reviewed. Allergies and medications reviewed and updated.  Review of Systems  Per HPI unless specifically indicated above     Objective:    BP 122/73    Pulse 61    Temp 98.2 F (36.8 C) (Oral)    Wt 198 lb (89.8 kg)    SpO2 99%    BMI 30.11 kg/m   Wt Readings from Last 3 Encounters:  10/25/19 198 lb (89.8 kg)  04/05/19 173 lb (78.5 kg)  10/19/18 192 lb (87.1 kg)    Physical Exam Vitals and nursing note reviewed.  Constitutional:      Appearance: Normal appearance.  HENT:     Head: Atraumatic.  Eyes:     Extraocular Movements: Extraocular movements intact.     Conjunctiva/sclera: Conjunctivae normal.  Cardiovascular:     Rate and Rhythm: Normal rate and regular rhythm.  Pulmonary:     Effort: Pulmonary effort is normal.     Breath sounds: Normal breath sounds.  Musculoskeletal:        General: Normal range of motion.     Cervical back: Normal range of motion and neck supple.  Skin:    General: Skin is warm and dry.  Neurological:     General: No focal deficit present.     Mental Status: He is oriented to person, place, and time.  Psychiatric:        Mood and Affect: Mood normal.        Thought Content: Thought content normal.        Judgment: Judgment normal.      Results for orders placed or performed in visit on 04/06/19  HgB A1c  Result Value Ref Range   Hgb A1c MFr Bld 5.1 4.8 - 5.6 %   Est. average glucose Bld gHb Est-mCnc 100 mg/dL  Lipid Panel w/o Chol/HDL Ratio out  Result Value Ref Range   Cholesterol, Total 203 (H) 100 - 199 mg/dL   Triglycerides 50 0 - 149 mg/dL   HDL 81 >39 mg/dL   VLDL Cholesterol Cal 9 5 - 40 mg/dL   LDL Chol Calc (NIH) 113 (H) 0 - 99 mg/dL  Comprehensive metabolic panel  Result Value Ref Range   Glucose 90 65 - 99 mg/dL   BUN 12 6 - 24 mg/dL   Creatinine, Ser 0.99 0.76 - 1.27 mg/dL   GFR calc non Af Amer 89 >59 mL/min/1.73   GFR calc Af Amer 103 >59 mL/min/1.73   BUN/Creatinine Ratio 12 9 - 20   Sodium 137 134 - 144 mmol/L   Potassium 5.2 3.5 - 5.2 mmol/L   Chloride 103 96 - 106 mmol/L   CO2 22 20 - 29 mmol/L  Calcium 9.3 8.7 - 10.2 mg/dL   Total Protein 6.5 6.0 - 8.5 g/dL   Albumin 4.6 4.0 - 5.0 g/dL   Globulin, Total 1.9 1.5 - 4.5 g/dL   Albumin/Globulin Ratio 2.4 (H) 1.2 - 2.2   Bilirubin Total 0.8 0.0 - 1.2 mg/dL   Alkaline Phosphatase 60 39 - 117 IU/L   AST 20 0 - 40 IU/L   ALT 20 0 - 44 IU/L      Assessment & Plan:   Problem List Items Addressed This Visit      Cardiovascular and Mediastinum   Essential hypertension    BPs stable and WNL, continue current regimen      Relevant Orders   Comprehensive metabolic panel     Other   Hyperlipidemia - Primary    Recheck lipids, adjust as needed. Continue current regimen      Relevant Orders   Lipid Panel w/o Chol/HDL Ratio    Other Visit Diagnoses    Encounter for screening for HIV       Relevant Orders   HIV Antibody (routine testing w rflx)   Need for hepatitis C screening test       Relevant Orders   Hepatitis C antibody       Follow up plan: Return in about 6 months (around 04/25/2020) for CPE.

## 2019-10-25 NOTE — Assessment & Plan Note (Signed)
Recheck lipids, adjust as needed. Continue current regimen 

## 2019-10-25 NOTE — Assessment & Plan Note (Signed)
BPs stable and WNL, continue current regimen 

## 2019-10-26 LAB — COMPREHENSIVE METABOLIC PANEL
ALT: 34 IU/L (ref 0–44)
AST: 30 IU/L (ref 0–40)
Albumin/Globulin Ratio: 2.2 (ref 1.2–2.2)
Albumin: 4.6 g/dL (ref 4.0–5.0)
Alkaline Phosphatase: 60 IU/L (ref 48–121)
BUN/Creatinine Ratio: 11 (ref 9–20)
BUN: 11 mg/dL (ref 6–24)
Bilirubin Total: 0.7 mg/dL (ref 0.0–1.2)
CO2: 21 mmol/L (ref 20–29)
Calcium: 9 mg/dL (ref 8.7–10.2)
Chloride: 104 mmol/L (ref 96–106)
Creatinine, Ser: 0.97 mg/dL (ref 0.76–1.27)
GFR calc Af Amer: 106 mL/min/{1.73_m2} (ref >59)
GFR calc non Af Amer: 91 mL/min/{1.73_m2} (ref >59)
Globulin, Total: 2.1 g/dL (ref 1.5–4.5)
Glucose: 80 mg/dL (ref 65–99)
Potassium: 4.4 mmol/L (ref 3.5–5.2)
Sodium: 139 mmol/L (ref 134–144)
Total Protein: 6.7 g/dL (ref 6.0–8.5)

## 2019-10-26 LAB — HEPATITIS C ANTIBODY: Hep C Virus Ab: <0.1 s/co ratio (ref 0.0–0.9)

## 2019-10-26 LAB — LIPID PANEL W/O CHOL/HDL RATIO
Cholesterol, Total: 182 mg/dL (ref 100–199)
HDL: 87 mg/dL (ref >39)
LDL Chol Calc (NIH): 85 mg/dL (ref 0–99)
Triglycerides: 50 mg/dL (ref 0–149)
VLDL Cholesterol Cal: 10 mg/dL (ref 5–40)

## 2019-10-26 LAB — HIV ANTIBODY (ROUTINE TESTING W REFLEX): HIV Screen 4th Generation wRfx: NONREACTIVE

## 2019-11-10 HISTORY — PX: DISTAL BICEPS TENDON REPAIR: SHX1461

## 2019-11-19 ENCOUNTER — Other Ambulatory Visit: Payer: Self-pay | Admitting: Family Medicine

## 2019-11-19 NOTE — Telephone Encounter (Signed)
Requested Prescriptions  Pending Prescriptions Disp Refills  . lisinopril (ZESTRIL) 10 MG tablet [Pharmacy Med Name: Lisinopril 10 MG Oral Tablet] 90 tablet 1    Sig: Take 1 tablet by mouth once daily     Cardiovascular:  ACE Inhibitors Passed - 11/19/2019  6:20 AM      Passed - Cr in normal range and within 180 days    Creatinine, Ser  Date Value Ref Range Status  10/25/2019 0.97 0.76 - 1.27 mg/dL Final         Passed - K in normal range and within 180 days    Potassium  Date Value Ref Range Status  10/25/2019 4.4 3.5 - 5.2 mmol/L Final         Passed - Patient is not pregnant      Passed - Last BP in normal range    BP Readings from Last 1 Encounters:  10/25/19 122/73         Passed - Valid encounter within last 6 months    Recent Outpatient Visits          3 weeks ago Pure hypercholesterolemia   [facility] [place], [place]   7 months ago Pure hypercholesterolemia   [place], [place], [place]   1 year ago Essential hypertension   [place], [place], [place]   1 year ago Essential hypertension   [place], [place], [place]   1 year ago Pure hypercholesterolemia   [place], [place], [place]             . rosuvastatin (CRESTOR) 20 MG tablet [Pharmacy Med Name: Rosuvastatin Calcium 20 MG Oral Tablet] 90 tablet 0    Sig: Take 1 tablet by mouth once daily     Cardiovascular:  Antilipid - Statins Failed - 11/19/2019  6:20 AM      Failed - LDL in normal range and within 360 days    LDL Chol Calc (NIH)  Date Value Ref Range Status  10/25/2019 85 0 - 99 mg/dL Final         Passed - Total Cholesterol in normal range and within 360 days    Cholesterol, Total  Date Value Ref Range Status  10/25/2019 182 100 - 199 mg/dL Final   Cholesterol Piccolo, Waived  Date Value Ref Range Status  09/03/2016 176 <200 mg/dL Final    Comment:                             Desirable                <200                         Borderline High      200- 239                         High                     >239          Passed - HDL in normal range and within 360 days    HDL  Date Value Ref Range Status  10/25/2019 87 >39 mg/dL Final         Passed - Triglycerides in normal range and within 360 days    Triglycerides  Date Value Ref Range Status  10/25/2019 50 0 - 149 mg/dL Final   Triglycerides Piccolo,Waived  Date Value Ref Range Status  09/03/2016 87 <150 mg/dL Final    Comment:                            Normal                   <150                         Borderline High     150 - 199                         High                200 - 499                         Very High                >499          Passed - Patient is not pregnant      Passed - Valid encounter within last 12 months    Recent Outpatient Visits          3 weeks ago Pure hypercholesterolemia   [facility] [place] Ro[place], [place]   7 months ago Pure hypercholesterolemia   [place], [place], [place]   1 year ago Essential hypertension   [facility] [place], [place]   1 year ago Essential hypertension   [facility] [place], [place]   1 year ago Pure hypercholesterolemia   [place], [place], [place]

## 2020-01-22 ENCOUNTER — Ambulatory Visit

## 2020-04-01 ENCOUNTER — Other Ambulatory Visit: Payer: Self-pay

## 2020-04-01 MED ORDER — LISINOPRIL 10 MG PO TABS: 10.0000 mg | ORAL_TABLET | Freq: Every day | ORAL | 0 refills | Status: DC

## 2020-04-01 MED ORDER — ROSUVASTATIN CALCIUM 20 MG PO TABS: 20.0000 mg | ORAL_TABLET | Freq: Every day | ORAL | 0 refills | Status: DC

## 2020-04-01 NOTE — Telephone Encounter (Signed)
Pt verbalized understanding.

## 2020-07-08 ENCOUNTER — Other Ambulatory Visit: Payer: Self-pay

## 2020-07-08 ENCOUNTER — Encounter: Payer: Self-pay | Admitting: Nurse Practitioner

## 2020-07-08 ENCOUNTER — Ambulatory Visit (INDEPENDENT_AMBULATORY_CARE_PROVIDER_SITE_OTHER): Admitting: Nurse Practitioner

## 2020-07-08 VITALS — BP 138/89 | HR 65 | Temp 98.3°F | Ht 67.28 in | Wt 212.2 lb

## 2020-07-08 DIAGNOSIS — I10 Essential (primary) hypertension: Secondary | ICD-10-CM | POA: Diagnosis not present

## 2020-07-08 DIAGNOSIS — E7800 Pure hypercholesterolemia, unspecified: Secondary | ICD-10-CM | POA: Diagnosis not present

## 2020-07-08 DIAGNOSIS — Z1211 Encounter for screening for malignant neoplasm of colon: Secondary | ICD-10-CM | POA: Diagnosis not present

## 2020-07-08 DIAGNOSIS — D229 Melanocytic nevi, unspecified: Secondary | ICD-10-CM

## 2020-07-08 MED ORDER — LISINOPRIL 10 MG PO TABS: 10.0000 mg | ORAL_TABLET | Freq: Every day | ORAL | 1 refills | Status: DC

## 2020-07-08 MED ORDER — ROSUVASTATIN CALCIUM 20 MG PO TABS: 20.0000 mg | ORAL_TABLET | Freq: Every day | ORAL | 1 refills | Status: DC

## 2020-07-08 NOTE — Assessment & Plan Note (Signed)
Recheck lipids, adjust as needed. Continue current regimen 

## 2020-07-08 NOTE — Assessment & Plan Note (Signed)
BPs stable and WNL, continue current regimen.  Refills sent.  Labs ordered today.

## 2020-07-08 NOTE — Progress Notes (Signed)
BP 138/89   Pulse 65   Temp 98.3 F (36.8 C)   Ht 5' 7.28" (1.709 m)   Wt 212 lb 4 oz (96.3 kg)   SpO2 99%   BMI 32.96 kg/m    Subjective:    Patient ID: Jose Mosley, male    DOB: 1970/08/03, 50 y.o.   MRN: [id number]  HPI: Jose Mosley is a 50 y.o. male  Chief Complaint  Patient presents with  . Referral    Dermatology   HYPERTENSION / [place] Satisfied with current treatment? yes Duration of hypertension: years BP monitoring frequency: not checking regularly. BP range:  BP medication side effects: no Past BP meds: lisinopril Duration of hyperlipidemia: years Cholesterol medication side effects: no Cholesterol supplements: none Past cholesterol medications: rosuvastatin (crestor) Medication compliance: excellent compliance Aspirin: no Recent stressors: no Recurrent headaches: no Visual changes: no Palpitations: no Dyspnea: no Chest pain: no Lower extremity edema: no Dizzy/lightheaded: no   Relevant past medical, surgical, family and social history reviewed and updated as indicated. Interim medical history since our last visit reviewed. Allergies and medications reviewed and updated.  Review of Systems  Eyes: Negative for visual disturbance.  Respiratory: Negative for chest tightness and shortness of breath.   Cardiovascular: Negative for chest pain, palpitations and leg swelling.  Neurological: Negative for dizziness, light-headedness and headaches.    Per HPI unless specifically indicated above     Objective:    BP 138/89   Pulse 65   Temp 98.3 F (36.8 C)   Ht 5' 7.28" (1.709 m)   Wt 212 lb 4 oz (96.3 kg)   SpO2 99%   BMI 32.96 kg/m   Wt Readings from Last 3 Encounters:  07/08/20 212 lb 4 oz (96.3 kg)  10/25/19 198 lb (89.8 kg)  04/05/19 173 lb (78.5 kg)    Physical Exam Vitals and nursing note reviewed.  Constitutional:      General: He is not in acute distress.    Appearance: Normal appearance. He is not  ill-appearing, toxic-appearing or diaphoretic.  HENT:     Head: Normocephalic.     Right Ear: External ear normal.     Left Ear: External ear normal.     Nose: Nose normal. No congestion or rhinorrhea.     Mouth/Throat:     Mouth: Mucous membranes are moist.  Eyes:     General:        Right eye: No discharge.        Left eye: No discharge.     Extraocular Movements: Extraocular movements intact.     Conjunctiva/sclera: Conjunctivae normal.     Pupils: Pupils are equal, round, and reactive to light.  Cardiovascular:     Rate and Rhythm: Normal rate and regular rhythm.     Heart sounds: No murmur heard.   Pulmonary:     Effort: Pulmonary effort is normal. No respiratory distress.     Breath sounds: Normal breath sounds. No wheezing, rhonchi or rales.  Abdominal:     General: Abdomen is flat. Bowel sounds are normal.  Musculoskeletal:     Cervical back: Normal range of motion and neck supple.  Skin:    General: Skin is warm and dry.     Capillary Refill: Capillary refill takes less than 2 seconds.  Neurological:     General: No focal deficit present.     Mental Status: He is alert and oriented to person, place, and time.  Psychiatric:  Mood and Affect: Mood normal.        Behavior: Behavior normal.        Thought Content: Thought content normal.        Judgment: Judgment normal.     Results for orders placed or performed in visit on 10/25/19  Hepatitis C antibody  Result Value Ref Range   Hep C Virus Ab <0.1 0.0 - 0.9 s/co ratio  HIV Antibody (routine testing w rflx)  Result Value Ref Range   HIV Screen 4th Generation wRfx Non Reactive Non Reactive  Comprehensive metabolic panel  Result Value Ref Range   Glucose 80 65 - 99 mg/dL   BUN 11 6 - 24 mg/dL   Creatinine, Ser 0.97 0.76 - 1.27 mg/dL   GFR calc non Af Amer 91 >59 mL/min/1.73   GFR calc Af Amer 106 >59 mL/min/1.73   BUN/Creatinine Ratio 11 9 - 20   Sodium 139 134 - 144 mmol/L   Potassium 4.4 3.5 - 5.2  mmol/L   Chloride 104 96 - 106 mmol/L   CO2 21 20 - 29 mmol/L   Calcium 9.0 8.7 - 10.2 mg/dL   Total Protein 6.7 6.0 - 8.5 g/dL   Albumin 4.6 4.0 - 5.0 g/dL   Globulin, Total 2.1 1.5 - 4.5 g/dL   Albumin/Globulin Ratio 2.2 1.2 - 2.2   Bilirubin Total 0.7 0.0 - 1.2 mg/dL   Alkaline Phosphatase 60 48 - 121 IU/L   AST 30 0 - 40 IU/L   ALT 34 0 - 44 IU/L  Lipid Panel w/o Chol/HDL Ratio  Result Value Ref Range   Cholesterol, Total 182 100 - 199 mg/dL   Triglycerides 50 0 - 149 mg/dL   HDL 87 >39 mg/dL   VLDL Cholesterol Cal 10 5 - 40 mg/dL   LDL Chol Calc (NIH) 85 0 - 99 mg/dL      Assessment & Plan:   Problem List Items Addressed This Visit      Cardiovascular and Mediastinum   Essential hypertension - Primary    BPs stable and WNL, continue current regimen.  Refills sent.  Labs ordered today.       Relevant Medications   lisinopril (ZESTRIL) 10 MG tablet   rosuvastatin (CRESTOR) 20 MG tablet   Other Relevant Orders   Comp Met (CMET)   Lipid Profile     Other   Hyperlipidemia    Recheck lipids, adjust as needed. Continue current regimen      Relevant Medications   lisinopril (ZESTRIL) 10 MG tablet   rosuvastatin (CRESTOR) 20 MG tablet   Other Relevant Orders   Comp Met (CMET)   Lipid Profile    Other Visit Diagnoses    Screening for colon cancer       Relevant Orders   Ambulatory referral to Gastroenterology   Atypical mole       Patient would like referral to Derm for skin evaluation.   Relevant Orders   Ambulatory referral to Dermatology       Follow up plan: Return in about 6 months (around 01/08/2021) for Physical and Fasting labs.  A total of 30 minutes were spent on this encounter today.  When total time is documented, this includes both the face-to-face and non-face-to-face time personally spent before, during and after the visit on the date of the encounter.

## 2020-07-09 LAB — COMPREHENSIVE METABOLIC PANEL
ALT: 24 IU/L (ref 0–44)
AST: 21 IU/L (ref 0–40)
Albumin/Globulin Ratio: 2 (ref 1.2–2.2)
Albumin: 4.5 g/dL (ref 4.0–5.0)
Alkaline Phosphatase: 54 IU/L (ref 44–121)
BUN/Creatinine Ratio: 10 (ref 9–20)
BUN: 9 mg/dL (ref 6–24)
Bilirubin Total: 0.5 mg/dL (ref 0.0–1.2)
CO2: 20 mmol/L (ref 20–29)
Calcium: 9.4 mg/dL (ref 8.7–10.2)
Chloride: 107 mmol/L — ABNORMAL HIGH (ref 96–106)
Creatinine, Ser: 0.93 mg/dL (ref 0.76–1.27)
Globulin, Total: 2.3 g/dL (ref 1.5–4.5)
Glucose: 78 mg/dL (ref 65–99)
Potassium: 5 mmol/L (ref 3.5–5.2)
Sodium: 143 mmol/L (ref 134–144)
Total Protein: 6.8 g/dL (ref 6.0–8.5)
eGFR: 100 mL/min/{1.73_m2} (ref >59)

## 2020-07-09 LAB — LIPID PANEL
Chol/HDL Ratio: 2.2 ratio (ref 0.0–5.0)
Cholesterol, Total: 155 mg/dL (ref 100–199)
HDL: 69 mg/dL (ref >39)
LDL Chol Calc (NIH): 73 mg/dL (ref 0–99)
Triglycerides: 67 mg/dL (ref 0–149)
VLDL Cholesterol Cal: 13 mg/dL (ref 5–40)

## 2020-07-09 NOTE — Progress Notes (Signed)
Hi Jose Mosley.  It was a pleasure meeting you yesterday.  Your lab work looks great.  Continue with your current regimen.  Work on diet and exercise as discussed to help control your blood pressure.  I will see you in 6 months.

## 2020-07-23 ENCOUNTER — Other Ambulatory Visit: Payer: Self-pay

## 2020-07-23 ENCOUNTER — Telehealth (INDEPENDENT_AMBULATORY_CARE_PROVIDER_SITE_OTHER): Payer: Self-pay | Admitting: Gastroenterology

## 2020-07-23 DIAGNOSIS — S63269A Dislocation of metacarpophalangeal joint of unspecified finger, initial encounter: Secondary | ICD-10-CM | POA: Insufficient documentation

## 2020-07-23 DIAGNOSIS — H52229 Regular astigmatism, unspecified eye: Secondary | ICD-10-CM | POA: Insufficient documentation

## 2020-07-23 DIAGNOSIS — E7800 Pure hypercholesterolemia, unspecified: Secondary | ICD-10-CM | POA: Insufficient documentation

## 2020-07-23 DIAGNOSIS — Z0110 Encounter for examination of ears and hearing without abnormal findings: Secondary | ICD-10-CM | POA: Insufficient documentation

## 2020-07-23 DIAGNOSIS — F1020 Alcohol dependence, uncomplicated: Secondary | ICD-10-CM | POA: Insufficient documentation

## 2020-07-23 DIAGNOSIS — F1011 Alcohol abuse, in remission: Secondary | ICD-10-CM | POA: Insufficient documentation

## 2020-07-23 DIAGNOSIS — M2140 Flat foot [pes planus] (acquired), unspecified foot: Secondary | ICD-10-CM | POA: Insufficient documentation

## 2020-07-23 DIAGNOSIS — Z1211 Encounter for screening for malignant neoplasm of colon: Secondary | ICD-10-CM

## 2020-07-23 DIAGNOSIS — M7520 Bicipital tendinitis, unspecified shoulder: Secondary | ICD-10-CM | POA: Insufficient documentation

## 2020-07-23 DIAGNOSIS — Z23 Encounter for immunization: Secondary | ICD-10-CM | POA: Insufficient documentation

## 2020-07-23 DIAGNOSIS — H5310 Unspecified subjective visual disturbances: Secondary | ICD-10-CM | POA: Insufficient documentation

## 2020-07-23 DIAGNOSIS — R94120 Abnormal auditory function study: Secondary | ICD-10-CM | POA: Insufficient documentation

## 2020-07-23 DIAGNOSIS — M436 Torticollis: Secondary | ICD-10-CM | POA: Insufficient documentation

## 2020-07-23 DIAGNOSIS — J309 Allergic rhinitis, unspecified: Secondary | ICD-10-CM | POA: Insufficient documentation

## 2020-07-23 MED ORDER — PEG 3350-KCL-NA BICARB-NACL 420 G PO SOLR: 4000.0000 mL | Freq: Once | ORAL | 0 refills | Status: AC

## 2020-07-23 NOTE — Progress Notes (Signed)
Gastroenterology Pre-Procedure Review  Request Date: Wed 08/21/20 Requesting Physician: Dr. Bonna Gains  PATIENT REVIEW QUESTIONS: The patient responded to the following health history questions as indicated:    1. Are you having any GI issues? no 2. Do you have a personal history of Polyps? no 3. Do you have a family history of Colon Cancer or Polyps? no 4. Diabetes Mellitus? no 5. Joint replacements in the past 12 months?no 6. Major health problems in the past 3 months?no 7. Any artificial heart valves, MVP, or defibrillator?no    MEDICATIONS & ALLERGIES:    Patient reports the following regarding taking any anticoagulation/antiplatelet therapy:   Plavix, Coumadin, Eliquis, Xarelto, Lovenox, Pradaxa, Brilinta, or Effient? no Aspirin? no  Patient confirms/reports the following medications:  Current Outpatient Medications  Medication Sig Dispense Refill  . lisinopril (ZESTRIL) 10 MG tablet Take 1 tablet (10 mg total) by mouth daily. For further refills will need a visit please. 90 tablet 1  . rosuvastatin (CRESTOR) 20 MG tablet Take 1 tablet (20 mg total) by mouth daily. For further refills will need a visit please. 90 tablet 1   No current facility-administered medications for this visit.    Patient confirms/reports the following allergies:  No Known Allergies  No orders of the defined types were placed in this encounter.   AUTHORIZATION INFORMATION Primary Insurance: 1D#: Group #:  Secondary Insurance: 1D#: Group #:  SCHEDULE INFORMATION: Date: 08/21/20 Time: Location: [place]

## 2020-08-21 ENCOUNTER — Ambulatory Visit: Admitting: Anesthesiology

## 2020-08-21 ENCOUNTER — Ambulatory Visit
Admission: RE | Admit: 2020-08-21 | Discharge: 2020-08-21 | Disposition: A | Discharge status: Home / Self Care | Attending: Gastroenterology | Admitting: Gastroenterology

## 2020-08-21 ENCOUNTER — Encounter: Payer: Self-pay | Admitting: Gastroenterology

## 2020-08-21 ENCOUNTER — Other Ambulatory Visit: Payer: Self-pay

## 2020-08-21 ENCOUNTER — Encounter
Admission: RE | Disposition: A | Discharge status: Home / Self Care | Payer: Self-pay | Source: Home / Self Care | Attending: Gastroenterology

## 2020-08-21 DIAGNOSIS — Z87891 Personal history of nicotine dependence: Secondary | ICD-10-CM | POA: Diagnosis not present

## 2020-08-21 DIAGNOSIS — K635 Polyp of colon: Secondary | ICD-10-CM

## 2020-08-21 DIAGNOSIS — Z823 Family history of stroke: Secondary | ICD-10-CM | POA: Diagnosis not present

## 2020-08-21 DIAGNOSIS — Z8349 Family history of other endocrine, nutritional and metabolic diseases: Secondary | ICD-10-CM | POA: Insufficient documentation

## 2020-08-21 DIAGNOSIS — Z79899 Other long term (current) drug therapy: Secondary | ICD-10-CM | POA: Insufficient documentation

## 2020-08-21 DIAGNOSIS — Z833 Family history of diabetes mellitus: Secondary | ICD-10-CM | POA: Insufficient documentation

## 2020-08-21 DIAGNOSIS — K5730 Diverticulosis of large intestine without perforation or abscess without bleeding: Secondary | ICD-10-CM | POA: Insufficient documentation

## 2020-08-21 DIAGNOSIS — Z1211 Encounter for screening for malignant neoplasm of colon: Secondary | ICD-10-CM | POA: Diagnosis present

## 2020-08-21 DIAGNOSIS — Z8249 Family history of ischemic heart disease and other diseases of the circulatory system: Secondary | ICD-10-CM | POA: Insufficient documentation

## 2020-08-21 DIAGNOSIS — D124 Benign neoplasm of descending colon: Secondary | ICD-10-CM | POA: Diagnosis not present

## 2020-08-21 HISTORY — PX: COLONOSCOPY WITH PROPOFOL: SHX5780

## 2020-08-21 SURGERY — COLONOSCOPY WITH PROPOFOL
Anesthesia: General

## 2020-08-21 MED ORDER — PROPOFOL 500 MG/50ML IV EMUL
INTRAVENOUS | Status: AC
  Filled 2020-08-21: qty 50

## 2020-08-21 MED ORDER — PROPOFOL 10 MG/ML IV BOLUS
INTRAVENOUS | Status: AC
  Filled 2020-08-21: qty 20

## 2020-08-21 MED ORDER — SODIUM CHLORIDE 0.9 % IV SOLN: INTRAVENOUS | Status: DC

## 2020-08-21 MED ORDER — PROPOFOL 500 MG/50ML IV EMUL
INTRAVENOUS | Status: DC | PRN
  Administered 2020-08-21: 180 ug/kg/min via INTRAVENOUS

## 2020-08-21 MED ORDER — PROPOFOL 10 MG/ML IV BOLUS
INTRAVENOUS | Status: DC | PRN
  Administered 2020-08-21: 10 mg via INTRAVENOUS
  Administered 2020-08-21: 50 mg via INTRAVENOUS
  Administered 2020-08-21: 20 mg via INTRAVENOUS
  Administered 2020-08-21: 10 mg via INTRAVENOUS
  Administered 2020-08-21: 20 mg via INTRAVENOUS

## 2020-08-21 MED ORDER — LIDOCAINE HCL (CARDIAC) PF 100 MG/5ML IV SOSY
PREFILLED_SYRINGE | INTRAVENOUS | Status: DC | PRN
  Administered 2020-08-21: 100 mg via INTRAVENOUS

## 2020-08-21 NOTE — Anesthesia Procedure Notes (Signed)
Performed by: Allean Found, CRNA Ventilation: Nasal airway inserted- appropriate to patient size

## 2020-08-21 NOTE — Anesthesia Procedure Notes (Signed)
Date/Time: 08/21/2020 7:36 AM Performed by: Allean Found, CRNA Pre-anesthesia Checklist: Patient identified, Emergency Drugs available, Suction available, Patient being monitored and Timeout performed Oxygen Delivery Method: Nasal cannula Placement Confirmation: positive ETCO2

## 2020-08-21 NOTE — Transfer of Care (Signed)
Immediate Anesthesia Transfer of Care Note  Patient: Jose Mosley  Procedure(s) Performed: COLONOSCOPY WITH PROPOFOL (N/A )  Patient Location: PACU  Anesthesia Type:General  Level of Consciousness: awake, alert  and oriented  Airway & Oxygen Therapy: Patient Spontanous Breathing and Patient connected to nasal cannula oxygen  Post-op Assessment: Report given to RN and Post -op Vital signs reviewed and stable  Post vital signs: Reviewed and stable  Last Vitals:  Vitals Value Taken Time  BP 116/72 08/21/20 0814  Temp 36.1 C 08/21/20 0814  Pulse 73 08/21/20 0817  Resp 18 08/21/20 0816  SpO2 98 % 08/21/20 0817  Vitals shown include unvalidated device data.  Last Pain:  Vitals:   08/21/20 0814  TempSrc: Temporal  PainSc: 0-No pain         Complications: No complications documented.

## 2020-08-21 NOTE — Anesthesia Preprocedure Evaluation (Signed)
Anesthesia Evaluation  Patient identified by MRN, date of birth, ID band Patient awake    Reviewed: Allergy & Precautions, H&P , NPO status , Patient's Chart, lab work & pertinent test results  History of Anesthesia Complications Negative for: history of anesthetic complications  Airway Mallampati: III  TM Distance: >3 FB Neck ROM: full    Dental  (+) Chipped   Pulmonary neg shortness of breath, former smoker,    Pulmonary exam normal        Cardiovascular Exercise Tolerance: Good hypertension, (-) angina(-) Past MI and (-) DOE Normal cardiovascular exam     Neuro/Psych PSYCHIATRIC DISORDERS  Neuromuscular disease    GI/Hepatic negative GI ROS, Neg liver ROS, neg GERD  ,  Endo/Other  negative endocrine ROS  Renal/GU negative Renal ROS  negative genitourinary   Musculoskeletal   Abdominal   Peds  Hematology negative hematology ROS (+)   Anesthesia Other Findings Past Medical History: No date: Hyperlipidemia  Past Surgical History: 11/10/2019: DISTAL BICEPS TENDON REPAIR; Left No date: FINGER SURGERY  BMI    Body Mass Index: 31.93 kg/m      Reproductive/Obstetrics negative OB ROS                             Anesthesia Physical Anesthesia Plan  ASA: III  Anesthesia Plan: General   Post-op Pain Management:    Induction: Intravenous  PONV Risk Score and Plan: Propofol infusion and TIVA  Airway Management Planned: Natural Airway and Nasal Cannula  Additional Equipment:   Intra-op Plan:   Post-operative Plan:   Informed Consent: I have reviewed the patients History and Physical, chart, labs and discussed the procedure including the risks, benefits and alternatives for the proposed anesthesia with the patient or authorized representative who has indicated his/her understanding and acceptance.     Dental Advisory Given  Plan Discussed with: Anesthesiologist, CRNA and  Surgeon  Anesthesia Plan Comments: (Patient consented for risks of anesthesia including but not limited to:  - adverse reactions to medications - risk of airway placement if required - damage to eyes, teeth, lips or other oral mucosa - nerve damage due to positioning  - sore throat or hoarseness - Damage to heart, brain, nerves, lungs, other parts of body or loss of life  Patient voiced understanding.)        Anesthesia Quick Evaluation

## 2020-08-21 NOTE — Op Note (Signed)
[facility] Gastroenterology Patient Name: Jose Mosley Procedure Date: 08/21/2020 7:27 AM MRN: [id number] Account #: [id number] Date of Birth: 1970/11/12 Admit Type: Outpatient Age: 50 Room: [facility] ENDO ROOM 3 Gender: Male Note Status: Finalized Procedure:             Colonoscopy Indications:           Screening for colorectal malignant neoplasm Providers:             Gabriele Loveland B. Maximino Greenland MD, MD Referring MD:          Larae Grooms (Referring MD) Medicines:             Monitored Anesthesia Care Complications:         No immediate complications. Procedure:             Pre-Anesthesia Assessment:                        - ASA Grade Assessment: II - A patient with mild                         systemic disease.                        - Prior to the procedure, a History and Physical was                         performed, and patient medications, allergies and                         sensitivities were reviewed. The patient's tolerance                         of previous anesthesia was reviewed.                        - The risks and benefits of the procedure and the                         sedation options and risks were discussed with the                         patient. All questions were answered and informed                         consent was obtained.                        - Patient identification and proposed procedure were                         verified prior to the procedure by the physician, the                         nurse, the anesthesiologist, the anesthetist and the                         technician. The procedure was verified in the                         procedure room.  After obtaining informed consent, the colonoscope was                         passed under direct vision. Throughout the procedure,                         the patient's blood pressure, pulse, and oxygen                         saturations were  monitored continuously. The                         Colonoscope was introduced through the anus and                         advanced to the the cecum, identified by appendiceal                         orifice and ileocecal valve. The colonoscopy was                         performed with ease. The patient tolerated the                         procedure well. The quality of the bowel preparation                         was good. Findings:      The perianal and digital rectal examinations were normal.      Two sessile polyps were found in the descending colon. The polyps were 5       to 10 mm in size. These polyps were removed with a cold snare. Resection       and retrieval were complete.      Multiple diverticula were found in the sigmoid colon.      The exam was otherwise without abnormality.      The rectum, sigmoid colon, descending colon, transverse colon, ascending       colon and cecum appeared normal.      The retroflexed view of the distal rectum and anal verge was normal and       showed no anal or rectal abnormalities. Impression:            - Two 5 to 10 mm polyps in the descending colon,                         removed with a cold snare. Resected and retrieved.                        - Diverticulosis in the sigmoid colon.                        - The examination was otherwise normal.                        - The rectum, sigmoid colon, descending colon,                         transverse colon, ascending colon and cecum are normal.                        -  The distal rectum and anal verge are normal on                         retroflexion view. Recommendation:        - Discharge patient to home (with escort).                        - High fiber diet.                        - Advance diet as tolerated.                        - Continue present medications.                        - Await pathology results.                        - Repeat colonoscopy date to be determined after                          pending pathology results are reviewed.                        - The findings and recommendations were discussed with                         the patient.                        - The findings and recommendations were discussed with                         the patient's family.                        - Return to primary care physician as previously                         scheduled. Procedure Code(s):     --- Professional ---                        [id number], Colonoscopy, flexible; with removal of                         tumor(s), polyp(s), or other lesion(s) by snare                         technique Diagnosis Code(s):     --- Professional ---                        Z12.11, Encounter for screening for malignant neoplasm                         of colon                        K63.5, Polyp of colon CPT copyright 2019 American Medical Association. All rights reserved. The codes documented in this report are preliminary and upon coder review may  be revised to meet current compliance requirements.  Vonda Antigua, MD Margretta Sidle B. Bonna Gains MD, MD 08/21/2020 8:17:56 AM This report has been signed electronically. Number of Addenda: 0 Note Initiated On: 08/21/2020 7:27 AM Scope Withdrawal Time: 0 hours 16 minutes 52 seconds  Total Procedure Duration: 0 hours 24 minutes 54 seconds  Estimated Blood Loss:  Estimated blood loss: none.      [facility]

## 2020-08-21 NOTE — H&P (Signed)
Vonda Antigua, MD [address], [place], [place], [place], [address] [address], [place], [place], [place], [address] Phone: [phone]  Fax: [phone]  Primary Care Physician:  Jon Billings, NP   Pre-Procedure History & Physical: HPI:  Jose Mosley is a 50 y.o. male is here for a colonoscopy.   Past Medical History:  Diagnosis Date  . Hyperlipidemia     Past Surgical History:  Procedure Laterality Date  . DISTAL BICEPS TENDON REPAIR Left 11/10/2019  . FINGER SURGERY      Prior to Admission medications   Medication Sig Start Date End Date Taking? Authorizing Provider  lisinopril (ZESTRIL) 10 MG tablet Take 1 tablet (10 mg total) by mouth daily. For further refills will need a visit please. 07/08/20  Yes Jon Billings, NP  rosuvastatin (CRESTOR) 20 MG tablet Take 1 tablet (20 mg total) by mouth daily. For further refills will need a visit please. 07/08/20  Yes Jon Billings, NP    Allergies as of 07/23/2020  . (No Known Allergies)    Family History  Problem Relation Age of Onset  . Alcohol abuse Mother   . Diabetes Mother   . Heart disease Mother   . Hyperlipidemia Mother   . Stroke Mother   . Heart disease Father   . Hyperlipidemia Father   . Hypertension Father   . Stroke Father     Social History   Socioeconomic History  . Marital status: Legally Separated    Spouse name: Not on file  . Number of children: Not on file  . Years of education: Not on file  . Highest education level: Not on file  Occupational History  . Not on file  Tobacco Use  . Smoking status: Former Smoker    Quit date: 03/06/1990    Years since quitting: 30.4  . Smokeless tobacco: Never Used  Vaping Use  . Vaping Use: Never used  Substance and Sexual Activity  . Alcohol use: No    Alcohol/week: 0.0 standard drinks  . Drug use: No  . Sexual activity: Yes  Other Topics Concern  . Not on file  Social History Narrative  . Not on file   Social  Determinants of Health   Financial Resource Strain: Not on file  Food Insecurity: Not on file  Transportation Needs: Not on file  Physical Activity: Not on file  Stress: Not on file  Social Connections: Not on file  Intimate Partner Violence: Not on file    Review of Systems: See HPI, otherwise negative ROS  Physical Exam: BP 139/70   Pulse (!) 58   Temp 97.7 F (36.5 C) (Temporal)   Resp 17   Ht 5\' 8"  (1.727 m)   Wt 95.3 kg   SpO2 100%   BMI 31.93 kg/m  General:   Alert,  pleasant and cooperative in NAD Head:  Normocephalic and atraumatic. Neck:  Supple; no masses or thyromegaly. Lungs:  Clear throughout to auscultation, normal respiratory effort.    Heart:  +S1, +S2, Regular rate and rhythm, No edema. Abdomen:  Soft, nontender and nondistended. Normal bowel sounds, without guarding, and without rebound.   Neurologic:  Alert and  oriented x4;  grossly normal neurologically.  Impression/Plan: Jose Mosley is here for a colonoscopy to be performed for average risk screening.  Risks, benefits, limitations, and alternatives regarding  colonoscopy have been reviewed with the patient.  Questions have been answered.  All parties agreeable.   Virgel Manifold, MD  08/21/2020, 7:31 AM

## 2020-08-21 NOTE — Anesthesia Postprocedure Evaluation (Signed)
Anesthesia Post Note  Patient: Jose Mosley  Procedure(s) Performed: COLONOSCOPY WITH PROPOFOL (N/A )  Patient location during evaluation: Endoscopy Anesthesia Type: General Level of consciousness: awake and alert Pain management: pain level controlled Vital Signs Assessment: post-procedure vital signs reviewed and stable Respiratory status: spontaneous breathing, nonlabored ventilation, respiratory function stable and patient connected to nasal cannula oxygen Cardiovascular status: blood pressure returned to baseline and stable Postop Assessment: no apparent nausea or vomiting Anesthetic complications: no   No complications documented.   Last Vitals:  Vitals:   08/21/20 0824 08/21/20 0834  BP: 129/79 128/83  Pulse: 72 (!) 54  Resp: (!) 22 18  Temp:    SpO2: 99% 100%    Last Pain:  Vitals:   08/21/20 0834  TempSrc:   PainSc: 0-No pain                 Precious Haws Neelah Mannings

## 2020-08-22 ENCOUNTER — Encounter: Payer: Self-pay | Admitting: Gastroenterology

## 2020-08-22 LAB — SURGICAL PATHOLOGY

## 2020-08-27 ENCOUNTER — Encounter: Payer: Self-pay | Admitting: Gastroenterology

## 2020-09-18 ENCOUNTER — Other Ambulatory Visit: Payer: Self-pay

## 2020-09-18 ENCOUNTER — Ambulatory Visit: Admitting: Dermatology

## 2020-09-18 DIAGNOSIS — L820 Inflamed seborrheic keratosis: Secondary | ICD-10-CM

## 2020-09-18 DIAGNOSIS — D2239 Melanocytic nevi of other parts of face: Secondary | ICD-10-CM | POA: Diagnosis not present

## 2020-09-18 DIAGNOSIS — D2361 Other benign neoplasm of skin of right upper limb, including shoulder: Secondary | ICD-10-CM

## 2020-09-18 DIAGNOSIS — L570 Actinic keratosis: Secondary | ICD-10-CM | POA: Diagnosis not present

## 2020-09-18 DIAGNOSIS — D239 Other benign neoplasm of skin, unspecified: Secondary | ICD-10-CM

## 2020-09-18 DIAGNOSIS — L918 Other hypertrophic disorders of the skin: Secondary | ICD-10-CM | POA: Diagnosis not present

## 2020-09-18 DIAGNOSIS — D489 Neoplasm of uncertain behavior, unspecified: Secondary | ICD-10-CM

## 2020-09-18 NOTE — Patient Instructions (Signed)
Wound Care Instructions  1. Cleanse wound gently with soap and water once a day then pat dry with clean gauze. Apply a thing coat of Petrolatum (petroleum jelly, "Vaseline") over the wound (unless you have an allergy to this). We recommend that you use a new, sterile tube of Vaseline. Do not pick or remove scabs. Do not remove the yellow or white "healing tissue" from the base of the wound.  2. Cover the wound with fresh, clean, nonstick gauze and secure with paper tape. You may use Band-Aids in place of gauze and tape if the would is small enough, but would recommend trimming much of the tape off as there is often too much. Sometimes Band-Aids can irritate the skin.  3. You should call the office for your biopsy report after 1 week if you have not already been contacted.  4. If you experience any problems, such as abnormal amounts of bleeding, swelling, significant bruising, significant pain, or evidence of infection, please call the office immediately.  5. FOR ADULT SURGERY PATIENTS: If you need something for pain relief you may take 1 extra strength Tylenol (acetaminophen) AND 2 Ibuprofen (200mg  each) together every 4 hours as needed for pain. (do not take these if you are allergic to them or if you have a reason you should not take them.) Typically, you may only need pain medication for 1 to 3 days.    Cryotherapy Aftercare  . Wash gently with soap and water everyday.   Marland Kitchen Apply Vaseline and Band-Aid daily until healed.   If you have any questions or concerns for your doctor, please call our main line at [phone] and press option 4 to reach your doctor's medical assistant. If no one answers, please leave a voicemail as directed and we will return your call as soon as possible. Messages left after 4 pm will be answered the following business day.   You may also send [place] a message via [place]. We typically respond to MyChart messages within 1-2 business days.  For prescription refills,  please ask your pharmacy to contact our office. Our fax number is [phone].  If you have an urgent issue when the clinic is closed that cannot wait until the next business day, you can page your doctor at the number below.    Please note that while we do our best to be available for urgent issues outside of office hours, we are not available 24/7.   If you have an urgent issue and are unable to reach [place], you may choose to seek medical care at your doctor's office, retail clinic, urgent care center, or emergency room.  If you have a medical emergency, please immediately call 911 or go to the emergency department.  Pager Numbers  - Dr. Nehemiah Massed: [phone]  - Dr. Laurence Ferrari: [phone]  - Dr. Nicole Kindred: [phone]  In the event of inclement weather, please call our main line at [phone] for an update on the status of any delays or closures.  Dermatology Medication Tips: Please keep the boxes that topical medications come in in order to help keep track of the instructions about where and how to use these. Pharmacies typically print the medication instructions only on the boxes and not directly on the medication tubes.   If your medication is too expensive, please contact our office at [phone] option 4 or send [place] a message through [place].   We are unable to tell what your co-pay for medications will be in advance as this is different  depending on your insurance coverage. However, we may be able to find a substitute medication at lower cost or fill out paperwork to get insurance to cover a needed medication.   If a prior authorization is required to get your medication covered by your insurance company, please allow [place] 1-2 business days to complete this process.  Drug prices often vary depending on where the prescription is filled and some pharmacies may offer cheaper prices.  The website www.goodrx.com contains coupons for medications through different pharmacies. The prices  here do not account for what the cost may be with help from insurance (it may be cheaper with your insurance), but the website can give you the price if you did not use any insurance.  - You can print the associated coupon and take it with your prescription to the pharmacy.  - You may also stop by our office during regular business hours and pick up a GoodRx coupon card.  - If you need your prescription sent electronically to a different pharmacy, notify our office through [facility] or by phone at [phone] option 4.

## 2020-09-18 NOTE — Progress Notes (Signed)
   New Patient Visit  Subjective  Jose Mosley is a 50 y.o. male who presents for the following: Other (Spots of left cheek, nose, right arm and left axilla).  The following portions of the chart were reviewed this encounter and updated as appropriate:   Tobacco  Allergies  Meds  Problems  Med Hx  Surg Hx  Fam Hx     Review of Systems:  No other skin or systemic complaints except as noted in HPI or Assessment and Plan.  Objective  Well appearing patient in no apparent distress; mood and affect are within normal limits.  A focused examination was performed including face, arms, axilla. Relevant physical exam findings are noted in the Assessment and Plan.  Objective  Right tricep: Firm pink/brown papulenodule with dimple sign.   Objective  Left Axilla: Fleshy, skin-colored pedunculated papules.    Objective  Left cheek (2): Erythematous keratotic or waxy stuck-on papule or plaque.   Objective  Left Tip of Nose: 0.2 x 0.3 cm flesh colored papule   Assessment & Plan  Dermatofibroma Right tricep Benign-appearing.  Observation.  Call clinic for new or changing lesions.  Recommend daily use of broad spectrum spf 30+ sunscreen to sun-exposed areas.   Skin tag Left Axilla Benign-appearing.  Observation.  Call clinic for new or changing lesions.  Recommend daily use of broad spectrum spf 30+ sunscreen to sun-exposed areas.    Inflamed seborrheic keratosis (2) Left cheek Destruction of lesion - Left cheek Complexity: simple   Destruction method: cryotherapy   Informed consent: discussed and consent obtained   Timeout:  patient name, date of birth, surgical site, and procedure verified Lesion destroyed using liquid nitrogen: Yes   Region frozen until ice ball extended beyond lesion: Yes   Outcome: patient tolerated procedure well with no complications   Post-procedure details: wound care instructions given    Neoplasm of uncertain behavior Left Tip of  Nose Epidermal / dermal shaving  Lesion diameter (cm):  0.3 Informed consent: discussed and consent obtained   Timeout: patient name, date of birth, surgical site, and procedure verified   Procedure prep:  Patient was prepped and draped in usual sterile fashion Prep type:  Isopropyl alcohol Anesthesia: the lesion was anesthetized in a standard fashion   Anesthetic:  1% lidocaine w/ epinephrine 1-100,000 buffered w/ 8.4% NaHCO3 Instrument used: flexible razor blade   Hemostasis achieved with: pressure, aluminum chloride and electrodesiccation   Outcome: patient tolerated procedure well   Post-procedure details: sterile dressing applied and wound care instructions given   Dressing type: bandage and petrolatum    Specimen 1 - Surgical pathology Differential Diagnosis: Fibrous papule vs other  Check Margins: No 0.2 x 0.3 cm flesh colored papule  Return if symptoms worsen or fail to improve.  I, Ashok Cordia, CMA, am acting as scribe for Sarina Ser, MD .  Documentation: I have reviewed the above documentation for accuracy and completeness, and I agree with the above.  Sarina Ser, MD

## 2020-09-21 ENCOUNTER — Encounter: Payer: Self-pay | Admitting: Dermatology

## 2020-09-30 ENCOUNTER — Telehealth: Payer: Self-pay

## 2020-09-30 NOTE — Telephone Encounter (Signed)
Patient informed of pathology results and appointment scheduled.  °

## 2020-09-30 NOTE — Telephone Encounter (Signed)
-----   Message from Ralene Bathe, MD sent at 09/27/2020  1:08 PM EDT ----- Diagnosis Skin , left tip of nose ACTINIC KERATOSIS OVERLYING FIBROUS PAPULE, SEE DESCRIPTION  Benign Fibrous Papule  As suspected - may recur But ALSO has overlying PreCancerous Actinic Keratosis Schedule appt in 3 mos for recheck -- may need to treat PreCancer component if any residual (with LN2)

## 2020-11-14 ENCOUNTER — Ambulatory Visit: Admitting: Dermatology

## 2021-01-07 ENCOUNTER — Other Ambulatory Visit: Payer: Self-pay

## 2021-01-07 ENCOUNTER — Ambulatory Visit: Admitting: Dermatology

## 2021-01-07 DIAGNOSIS — L578 Other skin changes due to chronic exposure to nonionizing radiation: Secondary | ICD-10-CM | POA: Diagnosis not present

## 2021-01-07 DIAGNOSIS — L570 Actinic keratosis: Secondary | ICD-10-CM

## 2021-01-07 NOTE — Patient Instructions (Addendum)

## 2021-01-07 NOTE — Progress Notes (Signed)
   Follow-Up Visit   Subjective  Jose Mosley is a 50 y.o. male who presents for the following: Actinic Keratosis (Biopsy proven AK left tip of nose, pt here for treatment today ).  The following portions of the chart were reviewed this encounter and updated as appropriate:   Tobacco  Allergies  Meds  Problems  Med Hx  Surg Hx  Fam Hx     Review of Systems:  No other skin or systemic complaints except as noted in HPI or Assessment and Plan.  Objective  Well appearing patient in no apparent distress; mood and affect are within normal limits.  A focused examination was performed including face,nose. Relevant physical exam findings are noted in the Assessment and Plan.  left tip of nose Erythematous thin papules/macules with gritty scale.    Assessment & Plan  AK (actinic keratosis) left tip of nose  Biopsy proven Fibrous papule with Ak discussed   Actinic keratoses are precancerous spots that appear secondary to cumulative UV radiation exposure/sun exposure over time. They are chronic with expected duration over 1 year. A portion of actinic keratoses will progress to squamous cell carcinoma of the skin. It is not possible to reliably predict which spots will progress to skin cancer and so treatment is recommended to prevent development of skin cancer.  Recommend daily broad spectrum sunscreen SPF 30+ to sun-exposed areas, reapply every 2 hours as needed.  Recommend staying in the shade or wearing long sleeves, sun glasses (UVA+UVB protection) and wide brim hats (4-inch brim around the entire circumference of the hat). Call for new or changing lesions.   Destruction of lesion - left tip of nose Complexity: simple   Destruction method: cryotherapy   Informed consent: discussed and consent obtained   Timeout:  patient name, date of birth, surgical site, and procedure verified Lesion destroyed using liquid nitrogen: Yes   Region frozen until ice ball extended beyond lesion:  Yes   Outcome: patient tolerated procedure well with no complications   Post-procedure details: wound care instructions given    Actinic Damage - chronic, secondary to cumulative UV radiation exposure/sun exposure over time - diffuse scaly erythematous macules with underlying dyspigmentation - Recommend daily broad spectrum sunscreen SPF 30+ to sun-exposed areas, reapply every 2 hours as needed.  - Recommend staying in the shade or wearing long sleeves, sun glasses (UVA+UVB protection) and wide brim hats (4-inch brim around the entire circumference of the hat). - Call for new or changing lesions.   Return in about 1 year (around 01/07/2022) for Ak.  IMarye Round, CMA, am acting as scribe for Sarina Ser, MD .  Documentation: I have reviewed the above documentation for accuracy and completeness, and I agree with the above.  Sarina Ser, MD

## 2021-01-10 ENCOUNTER — Encounter: Payer: Self-pay | Admitting: Dermatology

## 2021-01-16 NOTE — Progress Notes (Signed)
BP 120/77   Pulse (!) 56   Temp 99 F (37.2 C) (Oral)   Ht 5\' 8"  (1.727 m)   Wt 215 lb 6.4 oz (97.7 kg)   SpO2 95%   BMI 32.75 kg/m    Subjective:    Patient ID: Jose Mosley, male    DOB: [birth year], 50 y.o.   MRN: [id number]  HPI: Jose Mosley is a 50 y.o. male presenting on 01/17/2021 for comprehensive medical examination. Current medical complaints include:none  He currently lives with: Interim Problems from his last visit: no  HYPERTENSION / HYPERLIPIDEMIA Satisfied with current treatment? no Duration of hypertension: years BP monitoring frequency: not checking BP range:  BP medication side effects: no Past BP meds: lisinopril Duration of hyperlipidemia: years Cholesterol medication side effects: no Cholesterol supplements: none Past cholesterol medications: rosuvastatin (crestor) Medication compliance: excellent compliance Aspirin: no Recent stressors: no Recurrent headaches: no Visual changes: no Palpitations: no Dyspnea: no Chest pain: no Lower extremity edema: no Dizzy/lightheaded: no  Patient states he has been fatigued and has lack of energy.  Would like his testosterone checked.  States it has never been checked before.    Depression Screen done today and results listed below:  Depression screen [facility] 2/9 07/08/2020 04/05/2019 04/01/2018  Decreased Interest 0 0 0  Down, Depressed, Hopeless 0 0 0  PHQ - 2 Score 0 0 0  Altered sleeping - 0 0  Tired, decreased energy - 0 0  Change in appetite - 0 0  Feeling bad or failure about yourself  - 0 0  Trouble concentrating - 0 0  Moving slowly or fidgety/restless - 0 0  Suicidal thoughts - 0 0  PHQ-9 Score - 0 0  Difficult doing work/chores - - Not difficult at all    The patient does not have a history of falls. I did complete a risk assessment for falls. A plan of care for falls was documented.   Past Medical History:  Past Medical History:  Diagnosis Date   Hyperlipidemia     Surgical  History:  Past Surgical History:  Procedure Laterality Date   COLONOSCOPY WITH PROPOFOL N/A 08/21/2020   Procedure: COLONOSCOPY WITH PROPOFOL;  Surgeon: Virgel Manifold, MD;  Location: [facility] ENDOSCOPY;  Service: Endoscopy;  Laterality: N/A;   DISTAL BICEPS TENDON REPAIR Left 11/10/2019   FINGER SURGERY      Medications:  Current Outpatient Medications on File Prior to Visit  Medication Sig   lisinopril (ZESTRIL) 10 MG tablet Take 1 tablet (10 mg total) by mouth daily. For further refills will need a visit please.   rosuvastatin (CRESTOR) 20 MG tablet Take 1 tablet (20 mg total) by mouth daily. For further refills will need a visit please.   No current facility-administered medications on file prior to visit.    Allergies:  No Known Allergies  Social History:  Social History   Socioeconomic History   Marital status: Married    Spouse name: Not on file   Number of children: Not on file   Years of education: Not on file   Highest education level: Not on file  Occupational History   Not on file  Tobacco Use   Smoking status: Former    Types: Cigarettes    Quit date: 03/06/1990    Years since quitting: 30.8   Smokeless tobacco: Never  Vaping Use   Vaping Use: Never used  Substance and Sexual Activity   Alcohol use: No    Alcohol/week: 0.0 standard drinks  Drug use: No   Sexual activity: Yes  Other Topics Concern   Not on file  Social History Narrative   Not on file   Social Determinants of Health   Financial Resource Strain: Not on file  Food Insecurity: Not on file  Transportation Needs: Not on file  Physical Activity: Not on file  Stress: Not on file  Social Connections: Not on file  Intimate Partner Violence: Not on file   Social History   Tobacco Use  Smoking Status Former   Types: Cigarettes   Quit date: 03/06/1990   Years since quitting: 30.8  Smokeless Tobacco Never   Social History   Substance and Sexual Activity  Alcohol Use No    Alcohol/week: 0.0 standard drinks    Family History:  Family History  Problem Relation Age of Onset   Alcohol abuse Mother    Diabetes Mother    Heart disease Mother    Hyperlipidemia Mother    Stroke Mother    Heart disease Father    Hyperlipidemia Father    Hypertension Father    Stroke Father     Past medical history, surgical history, medications, allergies, family history and social history reviewed with patient today and changes made to appropriate areas of the chart.   Review of Systems  Constitutional:  Positive for malaise/fatigue.  Eyes:  Negative for blurred vision and double vision.  Respiratory:  Negative for shortness of breath.   Cardiovascular:  Negative for chest pain, palpitations and leg swelling.  Neurological:  Negative for dizziness and headaches.  All other ROS negative except what is listed above and in the HPI.      Objective:    BP 120/77   Pulse (!) 56   Temp 99 F (37.2 C) (Oral)   Ht 5\' 8"  (1.727 m)   Wt 215 lb 6.4 oz (97.7 kg)   SpO2 95%   BMI 32.75 kg/m   Wt Readings from Last 3 Encounters:  01/17/21 215 lb 6.4 oz (97.7 kg)  08/21/20 210 lb (95.3 kg)  07/08/20 212 lb 4 oz (96.3 kg)    Physical Exam Vitals and nursing note reviewed.  Constitutional:      General: He is not in acute distress.    Appearance: Normal appearance. He is not ill-appearing, toxic-appearing or diaphoretic.  HENT:     Head: Normocephalic.     Right Ear: Tympanic membrane, ear canal and external ear normal.     Left Ear: Tympanic membrane, ear canal and external ear normal.     Nose: Nose normal. No congestion or rhinorrhea.     Mouth/Throat:     Mouth: Mucous membranes are moist.  Eyes:     General:        Right eye: No discharge.        Left eye: No discharge.     Extraocular Movements: Extraocular movements intact.     Conjunctiva/sclera: Conjunctivae normal.     Pupils: Pupils are equal, round, and reactive to light.  Cardiovascular:     Rate and  Rhythm: Normal rate and regular rhythm.     Heart sounds: No murmur heard. Pulmonary:     Effort: Pulmonary effort is normal. No respiratory distress.     Breath sounds: Normal breath sounds. No wheezing, rhonchi or rales.  Abdominal:     General: Abdomen is flat. Bowel sounds are normal. There is no distension.     Palpations: Abdomen is soft.     Tenderness: There is  no abdominal tenderness. There is no guarding.  Musculoskeletal:     Cervical back: Normal range of motion and neck supple.  Skin:    General: Skin is warm and dry.     Capillary Refill: Capillary refill takes less than 2 seconds.  Neurological:     General: No focal deficit present.     Mental Status: He is alert and oriented to person, place, and time.     Cranial Nerves: No cranial nerve deficit.     Motor: No weakness.     Deep Tendon Reflexes: Reflexes normal.  Psychiatric:        Mood and Affect: Mood normal.        Behavior: Behavior normal.        Thought Content: Thought content normal.        Judgment: Judgment normal.    Results for orders placed or performed during the hospital encounter of 08/21/20  Surgical pathology  Result Value Ref Range   SURGICAL PATHOLOGY      SURGICAL PATHOLOGY CASE: [id number] PATIENT: Osvaldo Shipper Surgical Pathology Report     Specimen Submitted: A. Colon polyp x2, descending; cold snare  Clinical History: Screening colonoscopy.  Polyps, diverticulosis      DIAGNOSIS: A. COLON POLYP X2, DESCENDING; COLD SNARE: - TUBULAR ADENOMA, TWO FRAGMENTS. - NEGATIVE FOR HIGH-GRADE DYSPLASIA AND MALIGNANCY.   GROSS DESCRIPTION: A. Labeled: Descending colon polyp x2 cold snare Received: Formalin Collection time: 7:58 AM on 08/21/2020 Placed into formalin time: 7:58 AM on 08/21/2020 Tissue fragment(s): Multiple Size: Aggregate, 3 x 0.7 x 0.3 cm Description: Received are fragments of tan soft tissue admixed with intestinal debris.  The ratio of soft tissue to  intestinal debris is 70: 30. Entirely submitted in 1 cassette.  RB 08/21/2020  Final Diagnosis performed by Betsy Pries, MD.   Electronically signed 08/22/2020 10:34:10AM The electronic signature indicates that the named Attending Patholo gist has evaluated the specimen Technical component performed at [place], [address], [place], [place] [address] Lab: [phone] Dir: Rush Farmer, MD, MMM  Professional component performed at [facility], [facility], [place], [place], [place] [address] Lab: [phone] Dir: Dellia Nims. Reuel Derby, MD       Assessment & Plan:   Problem List Items Addressed This Visit       Cardiovascular and Mediastinum   Essential hypertension    Chronic.  Controlled.  Continue with current medication regimen of Lisinopril 10mg .  Labs ordered today.  Refills sent today.  Return to clinic in 6 months for reevaluation.  Call sooner if concerns arise.          Other   Hyperlipidemia    Chronic.  Controlled.  Continue with current medication regimen of Crestor 20mg  daily.  Labs ordered today.  Refills sent today.  Return to clinic in 6 months for reevaluation.  Call sooner if concerns arise.        Other Visit Diagnoses     Annual physical exam    -  Primary   Health maintenenace reviewed during visit today. Refused Shingles and Flu shot.    Relevant Orders   TSH   PSA   Lipid panel   CBC with Differential/Platelet   Comprehensive metabolic panel   Urinalysis, Routine w reflex microscopic   Fatigue, unspecified type       Would like testosterone checked. Discussed that if it is low will need repeat and if still low will send to Urology.    Relevant Orders  Testosterone, free, total([facility])        Discussed aspirin prophylaxis for myocardial infarction prevention and decision was it was not indicated  LABORATORY TESTING:  Health maintenance labs ordered today as discussed above.   The natural history of  prostate cancer and ongoing controversy regarding screening and potential treatment outcomes of prostate cancer has been discussed with the patient. The meaning of a false positive PSA and a false negative PSA has been discussed. He indicates understanding of the limitations of this screening test and wishes to proceed with screening PSA testing.   IMMUNIZATIONS:   - Tdap: Tetanus vaccination status reviewed: last tetanus booster within 10 years. - Influenza: Refused - Pneumovax: Not applicable - Prevnar: Not applicable - HPV: Not applicable - Zostavax vaccine: Refused  SCREENING: - Colonoscopy: Up to date  Discussed with patient purpose of the colonoscopy is to detect colon cancer at curable precancerous or early stages   - AAA Screening: Not applicable  -Hearing Test: Not applicable  -Spirometry: Not applicable   PATIENT COUNSELING:    Sexuality: Discussed sexually transmitted diseases, partner selection, use of condoms, avoidance of unintended pregnancy  and contraceptive alternatives.   Advised to avoid cigarette smoking.  I discussed with the patient that most people either abstain from alcohol or drink within safe limits (<=14/week and <=4 drinks/occasion for males, <=7/weeks and <= 3 drinks/occasion for females) and that the risk for alcohol disorders and other health effects rises proportionally with the number of drinks per week and how often a drinker exceeds daily limits.  Discussed cessation/primary prevention of drug use and availability of treatment for abuse.   Diet: Encouraged to adjust caloric intake to maintain  or achieve ideal body weight, to reduce intake of dietary saturated fat and total fat, to limit sodium intake by avoiding high sodium foods and not adding table salt, and to maintain adequate dietary potassium and calcium preferably from fresh fruits, vegetables, and low-fat dairy products.    stressed the importance of regular exercise  Injury prevention:  Discussed safety belts, safety helmets, smoke detector, smoking near bedding or upholstery.   Dental health: Discussed importance of regular tooth brushing, flossing, and dental visits.   Follow up plan: NEXT PREVENTATIVE PHYSICAL DUE IN 1 YEAR. Return in about 6 months (around 07/17/2021) for HTN, HLD, DM2 FU.

## 2021-01-17 ENCOUNTER — Other Ambulatory Visit: Payer: Self-pay

## 2021-01-17 ENCOUNTER — Ambulatory Visit (INDEPENDENT_AMBULATORY_CARE_PROVIDER_SITE_OTHER): Admitting: Nurse Practitioner

## 2021-01-17 ENCOUNTER — Encounter: Payer: Self-pay | Admitting: Nurse Practitioner

## 2021-01-17 VITALS — BP 120/77 | HR 56 | Temp 99.0°F | Ht 68.0 in | Wt 215.4 lb

## 2021-01-17 DIAGNOSIS — E7800 Pure hypercholesterolemia, unspecified: Secondary | ICD-10-CM

## 2021-01-17 DIAGNOSIS — I10 Essential (primary) hypertension: Secondary | ICD-10-CM

## 2021-01-17 DIAGNOSIS — Z653 Problems related to other legal circumstances: Secondary | ICD-10-CM | POA: Insufficient documentation

## 2021-01-17 DIAGNOSIS — R5383 Other fatigue: Secondary | ICD-10-CM

## 2021-01-17 DIAGNOSIS — Z0000 Encounter for general adult medical examination without abnormal findings: Secondary | ICD-10-CM | POA: Diagnosis not present

## 2021-01-17 DIAGNOSIS — S63279A Dislocation of unspecified interphalangeal joint of unspecified finger, initial encounter: Secondary | ICD-10-CM | POA: Insufficient documentation

## 2021-01-17 LAB — MICROSCOPIC EXAMINATION
Bacteria, UA: NONE SEEN
Epithelial Cells (non renal): NONE SEEN /hpf (ref 0–10)
WBC, UA: NONE SEEN /hpf (ref 0–5)

## 2021-01-17 LAB — URINALYSIS, ROUTINE W REFLEX MICROSCOPIC
Bilirubin, UA: NEGATIVE
Glucose, UA: NEGATIVE
Ketones, UA: NEGATIVE
Leukocytes,UA: NEGATIVE
Nitrite, UA: NEGATIVE
Protein,UA: NEGATIVE
Specific Gravity, UA: 1.025 (ref 1.005–1.030)
Urobilinogen, Ur: 0.2 mg/dL (ref 0.2–1.0)
pH, UA: 5.5 (ref 5.0–7.5)

## 2021-01-17 MED ORDER — ROSUVASTATIN CALCIUM 20 MG PO TABS: 20.0000 mg | ORAL_TABLET | Freq: Every day | ORAL | 1 refills | Status: DC

## 2021-01-17 MED ORDER — LISINOPRIL 10 MG PO TABS: 10.0000 mg | ORAL_TABLET | Freq: Every day | ORAL | 1 refills | Status: DC

## 2021-01-17 NOTE — Assessment & Plan Note (Signed)
Chronic.  Controlled.  Continue with current medication regimen of Lisinopril 10mg .  Labs ordered today.  Refills sent today.  Return to clinic in 6 months for reevaluation.  Call sooner if concerns arise.

## 2021-01-17 NOTE — Assessment & Plan Note (Signed)
Chronic.  Controlled.  Continue with current medication regimen of Crestor 20mg  daily.  Labs ordered today.  Refills sent today.  Return to clinic in 6 months for reevaluation.  Call sooner if concerns arise.

## 2021-01-17 NOTE — Progress Notes (Signed)
Hi Jose Mosley. Your urine from today looks good.  I will send you another message once the rest of your lab work comes back.

## 2021-01-18 LAB — CBC WITH DIFFERENTIAL/PLATELET
Basophils Absolute: 0.1 10*3/uL (ref 0.0–0.2)
Basos: 1 %
EOS (ABSOLUTE): 0.2 10*3/uL (ref 0.0–0.4)
Eos: 4 %
Hematocrit: 45.1 % (ref 37.5–51.0)
Hemoglobin: 15.9 g/dL (ref 13.0–17.7)
Immature Grans (Abs): 0 10*3/uL (ref 0.0–0.1)
Immature Granulocytes: 0 %
Lymphocytes Absolute: 1.8 10*3/uL (ref 0.7–3.1)
Lymphs: 29 %
MCH: 30.9 pg (ref 26.6–33.0)
MCHC: 35.3 g/dL (ref 31.5–35.7)
MCV: 88 fL (ref 79–97)
Monocytes Absolute: 0.5 10*3/uL (ref 0.1–0.9)
Monocytes: 7 %
Neutrophils Absolute: 3.7 10*3/uL (ref 1.4–7.0)
Neutrophils: 59 %
Platelets: 235 10*3/uL (ref 150–450)
RBC: 5.14 x10E6/uL (ref 4.14–5.80)
RDW: 13 % (ref 11.6–15.4)
WBC: 6.2 10*3/uL (ref 3.4–10.8)

## 2021-01-18 LAB — COMPREHENSIVE METABOLIC PANEL
ALT: 19 IU/L (ref 0–44)
AST: 14 IU/L (ref 0–40)
Albumin/Globulin Ratio: 2.1 (ref 1.2–2.2)
Albumin: 4.2 g/dL (ref 4.0–5.0)
Alkaline Phosphatase: 55 IU/L (ref 44–121)
BUN/Creatinine Ratio: 7 — ABNORMAL LOW (ref 9–20)
BUN: 6 mg/dL (ref 6–24)
Bilirubin Total: 0.5 mg/dL (ref 0.0–1.2)
CO2: 22 mmol/L (ref 20–29)
Calcium: 9.2 mg/dL (ref 8.7–10.2)
Chloride: 105 mmol/L (ref 96–106)
Creatinine, Ser: 0.86 mg/dL (ref 0.76–1.27)
Globulin, Total: 2 g/dL (ref 1.5–4.5)
Glucose: 90 mg/dL (ref 65–99)
Potassium: 4.5 mmol/L (ref 3.5–5.2)
Sodium: 140 mmol/L (ref 134–144)
Total Protein: 6.2 g/dL (ref 6.0–8.5)
eGFR: 105 mL/min/{1.73_m2} (ref >59)

## 2021-01-18 LAB — LIPID PANEL
Chol/HDL Ratio: 2.3 ratio (ref 0.0–5.0)
Cholesterol, Total: 166 mg/dL (ref 100–199)
HDL: 71 mg/dL (ref >39)
LDL Chol Calc (NIH): 79 mg/dL (ref 0–99)
Triglycerides: 87 mg/dL (ref 0–149)
VLDL Cholesterol Cal: 16 mg/dL (ref 5–40)

## 2021-01-18 LAB — TESTOSTERONE, FREE, TOTAL, SHBG
Sex Hormone Binding: 21.6 nmol/L (ref 19.3–76.4)
Testosterone, Free: 12.7 pg/mL (ref 7.2–24.0)
Testosterone: 352 ng/dL (ref 264–916)

## 2021-01-18 LAB — TSH: TSH: 0.799 u[IU]/mL (ref 0.450–4.500)

## 2021-01-18 LAB — PSA: Prostate Specific Ag, Serum: 1.4 ng/mL (ref 0.0–4.0)

## 2021-01-19 NOTE — Progress Notes (Signed)
Hi Joe. It was nice to see you last week.  Your lab work looks good.  No concerns at this time. Continue with your current medication regimen.  Follow up as discussed.  Please let me know if you have any questions.

## 2021-12-01 ENCOUNTER — Other Ambulatory Visit: Payer: Self-pay | Admitting: Nurse Practitioner

## 2021-12-02 NOTE — Telephone Encounter (Signed)
Requested Prescriptions  Pending Prescriptions Disp Refills  . lisinopril (ZESTRIL) 10 MG tablet [Pharmacy Med Name: LISINOPRIL '10MG'$  TABLETS] 90 tablet 0    Sig: TAKE 1 TABLET BY MOUTH EVERY DAY. FOR MORE FILLS PATIENT NEEDS APPOINTMENT     Cardiovascular:  ACE Inhibitors Failed - 12/01/2021 11:03 AM      Failed - Cr in normal range and within 180 days    Creatinine, Ser  Date Value Ref Range Status  01/17/2021 0.86 0.76 - 1.27 mg/dL Final         Failed - K in normal range and within 180 days    Potassium  Date Value Ref Range Status  01/17/2021 4.5 3.5 - 5.2 mmol/L Final         Failed - Valid encounter within last 6 months    Recent Outpatient Visits          10 months ago Annual physical exam   [facility] Jon Billings, NP   1 year ago Essential hypertension   [facility] Jon Billings, NP   2 years ago Pure hypercholesterolemia   [facility] Volney American, [place]   2 years ago Pure hypercholesterolemia   [facility], [place], [place]   3 years ago Essential hypertension   [facility] [place], [place]      Future Appointments            In 1 month Ralene Bathe, MD [place] - Patient is not pregnant      Passed - Last BP in normal range    BP Readings from Last 1 Encounters:  01/17/21 120/77         . rosuvastatin (CRESTOR) 20 MG tablet [Pharmacy Med Name: ROSUVASTATIN '20MG'$  TABLETS] 90 tablet 1    Sig: TAKE 1 TABLET BY MOUTH EVERY DAY     Cardiovascular:  Antilipid - Statins 2 Failed - 12/01/2021 11:03 AM      Failed - Lipid Panel in normal range within the last 12 months    Cholesterol, Total  Date Value Ref Range Status  01/17/2021 166 100 - 199 mg/dL Final   Cholesterol Piccolo, Waived  Date Value Ref Range Status  09/03/2016 176 <200 mg/dL Final    Comment:                            Desirable                <200                          Borderline High      200- 239                         High                     >239    LDL Chol Calc (NIH)  Date Value Ref Range Status  01/17/2021 79 0 - 99 mg/dL Final   HDL  Date Value Ref Range Status  01/17/2021 71 >39 mg/dL Final   Triglycerides  Date Value Ref Range Status  01/17/2021 87 0 - 149 mg/dL Final   Triglycerides Piccolo,Waived  Date Value Ref Range Status  09/03/2016 87 <150 mg/dL Final  Comment:                            Normal                   <150                         Borderline High     150 - 199                         High                200 - 499                         Very High                >499          Passed - Cr in normal range and within 360 days    Creatinine, Ser  Date Value Ref Range Status  01/17/2021 0.86 0.76 - 1.27 mg/dL Final         Passed - Patient is not pregnant      Passed - Valid encounter within last 12 months    Recent Outpatient Visits          10 months ago Annual physical exam   [facility] Jon Billings, NP   1 year ago Essential hypertension   [facility] Jon Billings, NP   2 years ago Pure hypercholesterolemia   [facility] Volney American, [place]   2 years ago Pure hypercholesterolemia   [facility] Merrie Roof [place], [place]   3 years ago Essential hypertension   [facility] [place], [place]      Future Appointments            In 1 month Nehemiah Massed Monia Sabal, MD [place]

## 2021-12-04 ENCOUNTER — Ambulatory Visit: Payer: Self-pay

## 2021-12-04 NOTE — Telephone Encounter (Signed)
  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 Advised to callback if symptoms get worse.   Reason for Disposition  Side (flank) or lower back pain present  Answer Assessment - Initial Assessment Questions 1. SYMPTOM: "What's the main symptom you're concerned about?" (e.g., frequency, incontinence)     Sharp pain when starting stream, cloudiness 2. ONSET: "When did the  sx  start?"     Yesterday, possibly passed kidney stone 1-2 weeks ago  4. CAUSE: "What do you think is causing the symptoms?"     UTI or kidney infection 5. OTHER SYMPTOMS: "Do you have any other symptoms?" (e.g., blood in urine, fever, flank pain, pain with urination)     no  Protocols used: Urinary Symptoms-A-AH

## 2021-12-05 ENCOUNTER — Ambulatory Visit
Admission: RE | Admit: 2021-12-05 | Discharge: 2021-12-05 | Disposition: A | Discharge status: Home / Self Care | Source: Ambulatory Visit | Attending: Nurse Practitioner | Admitting: Nurse Practitioner

## 2021-12-05 ENCOUNTER — Encounter: Payer: Self-pay | Admitting: Nurse Practitioner

## 2021-12-05 ENCOUNTER — Ambulatory Visit: Admitting: Nurse Practitioner

## 2021-12-05 VITALS — BP 110/72 | HR 60 | Temp 99.3°F | Ht 68.0 in | Wt 165.3 lb

## 2021-12-05 DIAGNOSIS — R1031 Right lower quadrant pain: Secondary | ICD-10-CM

## 2021-12-05 DIAGNOSIS — R399 Unspecified symptoms and signs involving the genitourinary system: Secondary | ICD-10-CM | POA: Diagnosis not present

## 2021-12-05 LAB — URINALYSIS, ROUTINE W REFLEX MICROSCOPIC
Bilirubin, UA: NEGATIVE
Glucose, UA: NEGATIVE
Ketones, UA: NEGATIVE
Leukocytes,UA: NEGATIVE
Nitrite, UA: NEGATIVE
Specific Gravity, UA: >1.03 — ABNORMAL HIGH (ref 1.005–1.030)
Urobilinogen, Ur: 1 mg/dL (ref 0.2–1.0)
pH, UA: 6 (ref 5.0–7.5)

## 2021-12-05 LAB — MICROSCOPIC EXAMINATION
Bacteria, UA: NONE SEEN
Epithelial Cells (non renal): NONE SEEN /hpf (ref 0–10)
WBC, UA: NONE SEEN /hpf (ref 0–5)

## 2021-12-05 MED ORDER — SULFAMETHOXAZOLE-TRIMETHOPRIM 800-160 MG PO TABS: 1.0000 | ORAL_TABLET | Freq: Two times a day (BID) | ORAL | 0 refills | Status: AC

## 2021-12-05 NOTE — Addendum Note (Signed)
Addended by: Marnee Guarneri T on: 12/05/2021 12:20 PM   Modules accepted: Orders

## 2021-12-05 NOTE — Progress Notes (Signed)
Acute Office Visit  Subjective:     Patient ID: Jose Mosley, male    DOB: 1971/03/26, 51 y.o.   MRN: [id number]  Chief Complaint  Patient presents with   Dysuria   Urinary Tract Infection    Patient is here today for possible UTI. Patient is having symptoms of sharp pain with urination. Patient first noticed symptoms about a week ago, patient says he thinks he may passed a kidney stone, as he has not felt any recent pain.    Started having urinary symptoms one week ago, thinks he passed a kidney stone.  A couple weeks prior to that had pain to left flank.  Has not had kidney stones diagnosed before.  Family history of kidney stones, both parents.  Dysuria  This is a new problem. The current episode started 1 to 4 weeks ago. The problem occurs intermittently. The problem has been gradually improving. The quality of the pain is described as burning. The pain is at a severity of 1/10. The pain is mild. There has been no fever. He is Sexually active. There is No history of pyelonephritis. Associated symptoms include flank pain (a couple weeks ago and then nothing) and hematuria (a week ago there was blood). Pertinent negatives include no chills, frequency, hesitancy, nausea, urgency or vomiting. He has tried increased fluids for the symptoms. The treatment provided mild relief. His past medical history is significant for kidney stones (family history of, he has not been diagnosed).  Urinary Tract Infection  Associated symptoms include flank pain (a couple weeks ago and then nothing) and hematuria (a week ago there was blood). Pertinent negatives include no chills, frequency, hesitancy, nausea, urgency or vomiting. His past medical history is significant for kidney stones (family history of, he has not been diagnosed).   Patient is in today for urinary symptoms.  Review of Systems  Constitutional:  Negative for chills.  Gastrointestinal:  Negative for nausea and vomiting.  Genitourinary:   Positive for dysuria, flank pain (a couple weeks ago and then nothing) and hematuria (a week ago there was blood). Negative for frequency, hesitancy and urgency.      Objective:    BP 110/72   Pulse 60   Temp 99.3 F (37.4 C) (Oral)   Ht '5\' 8"'$  (1.727 m)   Wt 165 lb 4.8 oz (75 kg)   SpO2 97%   BMI 25.13 kg/m  BP Readings from Last 3 Encounters:  12/05/21 110/72  01/17/21 120/77  08/21/20 128/83   Wt Readings from Last 3 Encounters:  12/05/21 165 lb 4.8 oz (75 kg)  01/17/21 215 lb 6.4 oz (97.7 kg)  08/21/20 210 lb (95.3 kg)   Physical Exam Vitals and nursing note reviewed.  Constitutional:      General: He is awake. He is not in acute distress.    Appearance: He is well-developed and well-groomed. He is not ill-appearing or toxic-appearing.  HENT:     Head: Normocephalic and atraumatic.     Right Ear: Hearing normal. No drainage.     Left Ear: Hearing normal. No drainage.  Eyes:     General: Lids are normal.        Right eye: No discharge.        Left eye: No discharge.     Conjunctiva/sclera: Conjunctivae normal.     Pupils: Pupils are equal, round, and reactive to light.  Neck:     Thyroid: No thyromegaly.     Vascular: No carotid bruit.  Cardiovascular:  Rate and Rhythm: Normal rate and regular rhythm.     Heart sounds: Normal heart sounds, S1 normal and S2 normal. No murmur heard.    No gallop.  Pulmonary:     Effort: Pulmonary effort is normal. No accessory muscle usage or respiratory distress.     Breath sounds: Normal breath sounds.  Abdominal:     General: Bowel sounds are normal. There is no distension.     Palpations: Abdomen is soft.     Tenderness: There is no abdominal tenderness. There is no right CVA tenderness, left CVA tenderness or guarding.  Musculoskeletal:        General: Normal range of motion.     Cervical back: Normal range of motion and neck supple.     Right lower leg: No edema.     Left lower leg: No edema.  Skin:    General: Skin  is warm and dry.     Capillary Refill: Capillary refill takes less than 2 seconds.     Findings: No rash.  Neurological:     Mental Status: He is alert and oriented to person, place, and time.     Deep Tendon Reflexes: Reflexes are normal and symmetric.  Psychiatric:        Attention and Perception: Attention normal.        Mood and Affect: Mood normal.        Speech: Speech normal.        Behavior: Behavior normal. Behavior is cooperative.        Thought Content: Thought content normal.     Results for orders placed or performed in visit on 12/05/21  Microscopic Examination   Urine  Result Value Ref Range   WBC, UA None seen 0 - 5 /hpf   RBC, Urine 0-2 0 - 2 /hpf   Epithelial Cells (non renal) None seen 0 - 10 /hpf   Mucus, UA Present (A) Not Estab.   Bacteria, UA None seen None seen/Few  Urinalysis, Routine w reflex microscopic  Result Value Ref Range   Specific Gravity, UA >1.030 (H) 1.005 - 1.030   pH, UA 6.0 5.0 - 7.5   Color, UA Yellow Yellow   Appearance Ur Clear Clear   Leukocytes,UA Negative Negative   Protein,UA 1+ (A) Negative/Trace   Glucose, UA Negative Negative   Ketones, UA Negative Negative   RBC, UA Trace (A) Negative   Bilirubin, UA Negative Negative   Urobilinogen, Ur 1.0 0.2 - 1.0 mg/dL   Nitrite, UA Negative Negative   Microscopic Examination See below:         Assessment & Plan:   Problem List Items Addressed This Visit       Other   Urinary symptom or sign - Primary    Suspect kidney stone based on HPI and symptoms.  UA with trace BLD, but no other findings.  Will send for CT abdomen/pelvis today to further assess.  Recommend focus on diet changes and increased hydration.  He requests PSA on labs today (recent intercourse) discussed this may be elevated and may need recheck if elevation.  Determine next steps after CT returns.      Relevant Orders   Urinalysis, Routine w reflex microscopic (Completed)   PSA   Other Visit Diagnoses      Right lower quadrant abdominal pain       Refer to urinary symptoms.   Relevant Orders   CT Abdomen Pelvis Wo Contrast       No  orders of the defined types were placed in this encounter.   No follow-ups on file.  Venita Lick, NP

## 2021-12-05 NOTE — Assessment & Plan Note (Signed)
Suspect kidney stone based on HPI and symptoms.  UA with trace BLD, but no other findings.  Will send for CT abdomen/pelvis today to further assess.  Recommend focus on diet changes and increased hydration.  He requests PSA on labs today (recent intercourse) discussed this may be elevated and may need recheck if elevation.  Determine next steps after CT returns.

## 2021-12-05 NOTE — Progress Notes (Signed)
Contacted via MyChart -- need 4 week follow-up in office please   Good afternoon Jose Mosley, I have good news and bad news.  Glad we got the imaging, it was very helpful!!  You do not have any kidney stones, but it does look like you have prostatitis.  Your prostate is slightly enlarged and has calcifications, which we often see with prostatitis on imaging.  This also means your PSA may be elevated due to this or recent intercourse.  I am sending in Bactrim DS which is used for treatment, you will take this twice a day for 4 weeks.  I recommend a follow-up in 4 weeks in office to recheck urine, PSA, and ensure symptoms improving.  Any questions? Keep being stellar!!  Thank you for allowing me to participate in your care.  I appreciate you. Kindest regards, [name]

## 2021-12-05 NOTE — Patient Instructions (Addendum)
IMAGING LOCATION: [address], [place], [place] [address] [phone]    Coronary Calcium Scan A coronary calcium scan is an imaging test used to look for deposits of plaque in the inner lining of the blood vessels of the heart (coronary arteries). Plaque is made up of calcium, protein, and fatty substances. These deposits of plaque can partly clog and narrow the coronary arteries without producing any symptoms or warning signs. This puts a person at risk for a heart attack. A coronary calcium scan is performed using a computed tomography (CT) scanner machine without using a dye (contrast). This test is recommended for people who are at moderate risk for heart disease. The test can find plaque deposits before symptoms develop. Tell a health care provider about: Any allergies you have. All medicines you are taking, including vitamins, herbs, eye drops, creams, and over-the-counter medicines. Any problems you or family members have had with anesthetic medicines. Any bleeding problems you have. Any surgeries you have had. Any medical conditions you have. Whether you are pregnant or may be pregnant. What are the risks? Generally, this is a safe procedure. However, problems may occur, including: Harm to a pregnant woman and her unborn baby. This test involves the use of radiation. Radiation exposure can be dangerous to a pregnant woman and her unborn baby. If you are pregnant or think you may be pregnant, you should not have this procedure done. A slight increase in the risk of cancer. This is because of the radiation involved in the test. The amount of radiation from one test is similar to the amount of radiation you are naturally exposed to over one year. What happens before the procedure? Ask your health care provider for any specific instructions on how to prepare for this procedure. You may be asked to avoid products that contain caffeine, tobacco, or nicotine for 4 hours before  the procedure. What happens during the procedure?  You will undress and remove any jewelry from your neck or chest. You may need to remove hearing aides and dentures. Women may need to remove their bras. You will put on a hospital gown. Sticky electrodes will be placed on your chest. The electrodes will be connected to an electrocardiogram (ECG) machine to record a tracing of the electrical activity of your heart. You will lie down on your back on a curved bed that is attached to the [place]. You may be given medicine to slow down your heart rate so that clear pictures can be created. You will be moved into the CT scanner, and the CT scanner will take pictures of your heart. During this time, you will be asked to lie still and hold your breath for 10-20 seconds at a time while each picture of your heart is being taken. The procedure may vary among health care providers and hospitals. What can I expect after the procedure? You can return to your normal activities. It is up to you to get the results of your procedure. Ask your health care provider, or the department that is doing the procedure, when your results will be ready. Summary A coronary calcium scan is an imaging test used to look for deposits of plaque in the inner lining of the blood vessels of the heart. Plaque is made up of calcium, protein, and fatty substances. A coronary calcium scan is performed using a CT scanner machine without contrast. Generally, this is a safe procedure. Tell your health care provider if you are pregnant or  may be pregnant. Ask your health care provider for any specific instructions on how to prepare for this procedure. You can return to your normal activities after the scan is done. This information is not intended to replace advice given to you by your health care provider. Make sure you discuss any questions you have with your health care provider. Document Revised: 03/23/2021 Document Reviewed:  03/23/2021 Elsevier Patient Education  [place].

## 2021-12-06 LAB — PSA: Prostate Specific Ag, Serum: 1.5 ng/mL (ref 0.0–4.0)

## 2021-12-06 NOTE — Progress Notes (Signed)
Contacted via [place], your PSA remains in normal range.  We will treat infection and see how your symptoms are after treatment is complete.  Suspect much of the enlargement on recent imaging was from inflammation from infection, so glad we obtained imaging, especially with your normal PSA:) At next visit I recommend a manual prostate exam to ensure inflammation improved and no tenderness present.  Any questions? Keep being amazing!!  Thank you for allowing me to participate in your care.  I appreciate you. Kindest regards, Zonnie Landen

## 2022-01-07 ENCOUNTER — Ambulatory Visit: Admitting: Dermatology

## 2022-01-10 NOTE — Patient Instructions (Signed)
Prostatitis  Prostatitis is swelling of the prostate gland, also called the prostate. This gland is about 1.5 inches wide and 1 inch high, and it helps to make a fluid called semen. The prostate is below a man's bladder, in front of the butt (rectum). There are different types of prostatitis. What are the causes? One type of prostatitis is caused by an infection from germs (bacteria). Another type is not caused by germs. It may be caused by: Things having to do with the nervous system. This system includes thebrain, spinal cord, and nerves. An autoimmune response. This happens when the body's disease-fighting system attacks healthy tissue in the body by mistake. Psychological factors. These have to do with how the mind works. The causes of other types of prostatitis are normally not known. What are the signs or symptoms? Symptoms of this condition depend on the type of prostatitis you have. If your condition is caused by germs: You may feel pain or burning when you pee (urinate). You may pee often and all of a sudden. You may have problems starting to pee. You may have trouble emptying your bladder when you pee. You may have fever or chills. You may feel pain in your muscles, joints, low back, or lower belly. If you have other types of prostatitis: You may pee often or all of a sudden. You may have trouble starting to pee. You may have a weak flow when you pee. You may leak pee after using the bathroom. You may have other problems, such as: Abnormal fluid coming from the penis. Pain in the testicles or penis. Pain between the butt and the testicles. Pain when fluid comes out of the penis during sex. How is this treated? Treatment for this condition depends on the type of prostatitis. Treatment may include: Medicines. These may treat pain or swelling, or they may help relax muscles. Exercises to help you move better or get stronger (physical therapy). Heat therapy. Techniques to help  you control some of the ways that your body works. Exercises to help you relax. Antibiotic medicine, if your condition is caused by germs. Warm water baths (sitz baths) to relax muscles. Follow these instructions at home: Medicines Take over-the-counter and prescription medicines only as told by your doctor. If you were prescribed an antibiotic medicine, take it as told by your doctor. Do not stop using the antibiotic even if you start to feel better. Managing pain and swelling  Take sitz baths as told by your doctor. For a sitz bath, sit in warm water that is deep enough to cover your hips and butt. If told, put heat on the painful area. Do this as often as told by your doctor. Use the heat source that your doctor recommends, such as a moist heat pack or a heating pad. Place a towel between your skin and the heat source. Leave the heat on for 20-30 minutes. Take off the heat if your skin turns bright red. This is very important if you are unable to feel pain, heat, or cold. You may have a greater risk of getting burned. General instructions Do exercises as told by your doctor, if your doctor prescribed them. Keep all follow-up visits as told by your doctor. This is important. Where to find more information National Institute of Diabetes and Digestive and Kidney Diseases: https://www.niddk.nih.gov Contact a doctor if: Your symptoms get worse. You have a fever. Get help right away if: You have chills. You feel light-headed. You feel like you may   faint. You cannot pee. You have blood or clumps of blood (blood clots) in your pee. Summary Prostatitis is swelling of the prostate gland. There are different types of prostatitis. Treatment depends on the type that you have. Take over-the-counter and prescription medicines only as told by your doctor. Get help right away of you have chills, feel light-headed, or feel like you may faint. Also get help right away if you cannot pee or you have  blood or clumps of blood in your pee. This information is not intended to replace advice given to you by your health care provider. Make sure you discuss any questions you have with your health care provider. Document Revised: 05/19/2019 Document Reviewed: 05/19/2019 Elsevier Patient Education  2023 [employer].  

## 2022-01-12 ENCOUNTER — Encounter: Payer: Self-pay | Admitting: Nurse Practitioner

## 2022-01-12 ENCOUNTER — Ambulatory Visit (INDEPENDENT_AMBULATORY_CARE_PROVIDER_SITE_OTHER): Admitting: Nurse Practitioner

## 2022-01-12 VITALS — BP 124/76 | HR 63 | Temp 98.5°F | Ht 68.0 in | Wt 167.2 lb

## 2022-01-12 DIAGNOSIS — N410 Acute prostatitis: Secondary | ICD-10-CM | POA: Diagnosis not present

## 2022-01-12 NOTE — Progress Notes (Signed)
BP 124/76   Pulse 63   Temp 98.5 F (36.9 C) (Oral)   Ht '5\' 8"'$  (1.727 m)   Wt 167 lb 3.2 oz (75.8 kg)   SpO2 98%   BMI 25.42 kg/m    Subjective:    Patient ID: Jose Mosley, male    DOB: [birth year], 51 y.o.   MRN: [id number]  HPI: Jose Mosley is a 51 y.o. male  Chief Complaint  Patient presents with   Prostatitis    Patient is here to follow up on Prostatitis. Patient says everything is going fine.    PROSTATITIS Treated with Bactrim DS for 4 weeks on 12/05/21.  Reports no further symptoms present and overall feeling better. Dysuria: no Urinary frequency: no Urgency: no Small volume voids: no Symptom severity: no Urinary incontinence: no Foul odor: no Hematuria: no Abdominal pain: no Back pain: no Suprapubic pain/pressure: no Flank pain: no Fever:  no Vomiting: no Status: better Sexual activity: monogamous History of sexually transmitted disease: no Treatments attempted: antibiotics    Relevant past medical, surgical, family and social history reviewed and updated as indicated. Interim medical history since our last visit reviewed. Allergies and medications reviewed and updated.  Review of Systems  Constitutional:  Negative for activity change, diaphoresis, fatigue and fever.  Respiratory:  Negative for cough, chest tightness, shortness of breath and wheezing.   Cardiovascular:  Negative for chest pain, palpitations and leg swelling.  Genitourinary: Negative.   Neurological: Negative.   Psychiatric/Behavioral: Negative.      Per HPI unless specifically indicated above     Objective:    BP 124/76   Pulse 63   Temp 98.5 F (36.9 C) (Oral)   Ht '5\' 8"'$  (1.727 m)   Wt 167 lb 3.2 oz (75.8 kg)   SpO2 98%   BMI 25.42 kg/m   Wt Readings from Last 3 Encounters:  01/12/22 167 lb 3.2 oz (75.8 kg)  12/05/21 165 lb 4.8 oz (75 kg)  01/17/21 215 lb 6.4 oz (97.7 kg)    Physical Exam Vitals and nursing note reviewed.  Constitutional:      General:  He is awake. He is not in acute distress.    Appearance: He is well-developed and well-groomed. He is not ill-appearing or toxic-appearing.  HENT:     Head: Normocephalic and atraumatic.     Right Ear: Hearing normal. No drainage.     Left Ear: Hearing normal. No drainage.  Eyes:     General: Lids are normal.        Right eye: No discharge.        Left eye: No discharge.     Conjunctiva/sclera: Conjunctivae normal.     Pupils: Pupils are equal, round, and reactive to light.  Neck:     Thyroid: No thyromegaly.     Vascular: No carotid bruit.  Cardiovascular:     Rate and Rhythm: Normal rate and regular rhythm.     Heart sounds: Normal heart sounds, S1 normal and S2 normal. No murmur heard.    No gallop.  Pulmonary:     Effort: Pulmonary effort is normal. No accessory muscle usage or respiratory distress.     Breath sounds: Normal breath sounds.  Abdominal:     General: Bowel sounds are normal. There is no distension.     Palpations: Abdomen is soft.     Tenderness: There is no abdominal tenderness. There is no right CVA tenderness, left CVA tenderness or guarding.  Musculoskeletal:  General: Normal range of motion.     Cervical back: Normal range of motion and neck supple.     Right lower leg: No edema.     Left lower leg: No edema.  Skin:    General: Skin is warm and dry.     Capillary Refill: Capillary refill takes less than 2 seconds.     Findings: No rash.  Neurological:     Mental Status: He is alert and oriented to person, place, and time.     Deep Tendon Reflexes: Reflexes are normal and symmetric.  Psychiatric:        Attention and Perception: Attention normal.        Mood and Affect: Mood normal.        Speech: Speech normal.        Behavior: Behavior normal. Behavior is cooperative.        Thought Content: Thought content normal.     Results for orders placed or performed in visit on 12/05/21  Microscopic Examination   Urine  Result Value Ref Range    WBC, UA None seen 0 - 5 /hpf   RBC, Urine 0-2 0 - 2 /hpf   Epithelial Cells (non renal) None seen 0 - 10 /hpf   Mucus, UA Present (A) Not Estab.   Bacteria, UA None seen None seen/Few  Urinalysis, Routine w reflex microscopic  Result Value Ref Range   Specific Gravity, UA >1.030 (H) 1.005 - 1.030   pH, UA 6.0 5.0 - 7.5   Color, UA Yellow Yellow   Appearance Ur Clear Clear   Leukocytes,UA Negative Negative   Protein,UA 1+ (A) Negative/Trace   Glucose, UA Negative Negative   Ketones, UA Negative Negative   RBC, UA Trace (A) Negative   Bilirubin, UA Negative Negative   Urobilinogen, Ur 1.0 0.2 - 1.0 mg/dL   Nitrite, UA Negative Negative   Microscopic Examination See below:   PSA  Result Value Ref Range   Prostate Specific Ag, Serum 1.5 0.0 - 4.0 ng/mL      Assessment & Plan:   Problem List Items Addressed This Visit       Genitourinary   Acute prostatitis with hematuria - Primary    Acute and improved at this time with no further symptoms.  Recommend to monitor closely and if symptoms return then immediately alert office.      Relevant Orders   Urinalysis, Routine w reflex microscopic     Follow up plan: Return if symptoms worsen or fail to improve.

## 2022-01-12 NOTE — Assessment & Plan Note (Signed)
Acute and improved at this time with no further symptoms.  Recommend to monitor closely and if symptoms return then immediately alert office.

## 2022-03-03 ENCOUNTER — Other Ambulatory Visit: Payer: Self-pay

## 2022-03-03 NOTE — Telephone Encounter (Signed)
Med refill request for rosuvastatin and lisinopril both filled on 12/02/2021 both #90. Last office visit on 01/12/2022 with Marnee Guarneri NP. No follow up appt scheduled. Please advise.

## 2022-07-20 ENCOUNTER — Ambulatory Visit: Admitting: Dermatology

## 2022-07-20 ENCOUNTER — Ambulatory Visit (INDEPENDENT_AMBULATORY_CARE_PROVIDER_SITE_OTHER): Admitting: Dermatology

## 2022-07-20 VITALS — BP 136/69 | HR 58

## 2022-07-20 DIAGNOSIS — L821 Other seborrheic keratosis: Secondary | ICD-10-CM

## 2022-07-20 DIAGNOSIS — L578 Other skin changes due to chronic exposure to nonionizing radiation: Secondary | ICD-10-CM

## 2022-07-20 DIAGNOSIS — L820 Inflamed seborrheic keratosis: Secondary | ICD-10-CM | POA: Diagnosis not present

## 2022-07-20 DIAGNOSIS — Z1283 Encounter for screening for malignant neoplasm of skin: Secondary | ICD-10-CM | POA: Diagnosis not present

## 2022-07-20 DIAGNOSIS — D239 Other benign neoplasm of skin, unspecified: Secondary | ICD-10-CM

## 2022-07-20 DIAGNOSIS — D2361 Other benign neoplasm of skin of right upper limb, including shoulder: Secondary | ICD-10-CM | POA: Diagnosis not present

## 2022-07-20 DIAGNOSIS — D229 Melanocytic nevi, unspecified: Secondary | ICD-10-CM

## 2022-07-20 DIAGNOSIS — L814 Other melanin hyperpigmentation: Secondary | ICD-10-CM

## 2022-07-20 DIAGNOSIS — L905 Scar conditions and fibrosis of skin: Secondary | ICD-10-CM | POA: Diagnosis not present

## 2022-07-20 DIAGNOSIS — D1801 Hemangioma of skin and subcutaneous tissue: Secondary | ICD-10-CM

## 2022-07-20 NOTE — Patient Instructions (Signed)
Due to recent changes in healthcare laws, you may see results of your pathology and/or laboratory studies on MyChart before the doctors have had a chance to review them. We understand that in some cases there may be results that are confusing or concerning to you. Please understand that not all results are received at the same time and often the doctors may need to interpret multiple results in order to provide you with the best plan of care or course of treatment. Therefore, we ask that you please give us 2 business days to thoroughly review all your results before contacting the office for clarification. Should we see a critical lab result, you will be contacted sooner.   If You Need Anything After Your Visit  If you have any questions or concerns for your doctor, please call our main line at [phone] and press option 4 to reach your doctor's medical assistant. If no one answers, please leave a voicemail as directed and we will return your call as soon as possible. Messages left after 4 pm will be answered the following business day.   You may also send us a message via MyChart. We typically respond to MyChart messages within 1-2 business days.  For prescription refills, please ask your pharmacy to contact our office. Our fax number is [phone].  If you have an urgent issue when the clinic is closed that cannot wait until the next business day, you can page your doctor at the number below.    Please note that while we do our best to be available for urgent issues outside of office hours, we are not available 24/7.   If you have an urgent issue and are unable to reach us, you may choose to seek medical care at your doctor's office, retail clinic, urgent care center, or emergency room.  If you have a medical emergency, please immediately call 911 or go to the emergency department.  Pager Numbers  - Dr. Kowalski: [phone]  - Dr. Moye: [phone]  - Dr. Stewart:  [phone]  In the event of inclement weather, please call our main line at [phone] for an update on the status of any delays or closures.  Dermatology Medication Tips: Please keep the boxes that topical medications come in in order to help keep track of the instructions about where and how to use these. Pharmacies typically print the medication instructions only on the boxes and not directly on the medication tubes.   If your medication is too expensive, please contact our office at [phone] option 4 or send us a message through MyChart.   We are unable to tell what your co-pay for medications will be in advance as this is different depending on your insurance coverage. However, we may be able to find a substitute medication at lower cost or fill out paperwork to get insurance to cover a needed medication.   If a prior authorization is required to get your medication covered by your insurance company, please allow us 1-2 business days to complete this process.  Drug prices often vary depending on where the prescription is filled and some pharmacies may offer cheaper prices.  The website www.goodrx.com contains coupons for medications through different pharmacies. The prices here do not account for what the cost may be with help from insurance (it may be cheaper with your insurance), but the website can give you the price if you did not use any insurance.  - You can print the associated coupon and take it with   your prescription to the pharmacy.  - You may also stop by our office during regular business hours and pick up a GoodRx coupon card.  - If you need your prescription sent electronically to a different pharmacy, notify our office through [facility] MyChart or by phone at [phone] option 4.     Si Usted Necesita Algo Despus de Su Visita  Tambin puede enviarnos un mensaje a travs de MyChart. Por lo general respondemos a los mensajes de MyChart en el transcurso de 1 a 2  das hbiles.  Para renovar recetas, por favor pida a su farmacia que se ponga en contacto con nuestra oficina. Nuestro nmero de fax es el [phone].  Si tiene un asunto urgente cuando la clnica est cerrada y que no puede esperar hasta el siguiente da hbil, puede llamar/localizar a su doctor(a) al nmero que aparece a continuacin.   Por favor, tenga en cuenta que aunque hacemos todo lo posible para estar disponibles para asuntos urgentes fuera del horario de oficina, no estamos disponibles las 24 horas del da, los 7 das de la semana.   Si tiene un problema urgente y no puede comunicarse con nosotros, puede optar por buscar atencin mdica  en el consultorio de su doctor(a), en una clnica privada, en un centro de atencin urgente o en una sala de emergencias.  Si tiene una emergencia mdica, por favor llame inmediatamente al 911 o vaya a la sala de emergencias.  Nmeros de bper  - Dr. Kowalski: [phone]  - Dra. Moye: [phone]  - Dra. Stewart: [phone]  En caso de inclemencias del tiempo, por favor llame a nuestra lnea principal al [phone] para una actualizacin sobre el estado de cualquier retraso o cierre.  Consejos para la medicacin en dermatologa: Por favor, guarde las cajas en las que vienen los medicamentos de uso tpico para ayudarle a seguir las instrucciones sobre dnde y cmo usarlos. Las farmacias generalmente imprimen las instrucciones del medicamento slo en las cajas y no directamente en los tubos del medicamento.   Si su medicamento es muy caro, por favor, pngase en contacto con nuestra oficina llamando al [phone] y presione la opcin 4 o envenos un mensaje a travs de MyChart.   No podemos decirle cul ser su copago por los medicamentos por adelantado ya que esto es diferente dependiendo de la cobertura de su seguro. Sin embargo, es posible que podamos encontrar un medicamento sustituto a menor costo o llenar un formulario para que el  seguro cubra el medicamento que se considera necesario.   Si se requiere una autorizacin previa para que su compaa de seguros cubra su medicamento, por favor permtanos de 1 a 2 das hbiles para completar este proceso.  Los precios de los medicamentos varan con frecuencia dependiendo del lugar de dnde se surte la receta y alguna farmacias pueden ofrecer precios ms baratos.  El sitio web www.goodrx.com tiene cupones para medicamentos de diferentes farmacias. Los precios aqu no tienen en cuenta lo que podra costar con la ayuda del seguro (puede ser ms barato con su seguro), pero el sitio web puede darle el precio si no utiliz ningn seguro.  - Puede imprimir el cupn correspondiente y llevarlo con su receta a la farmacia.  - Tambin puede pasar por nuestra oficina durante el horario de atencin regular y recoger una tarjeta de cupones de GoodRx.  - Si necesita que su receta se enve electrnicamente a una farmacia diferente, informe a nuestra oficina a travs de MyChart de [facility]   o por telfono llamando al [phone] y presione la opcin 4.  

## 2022-07-20 NOTE — Progress Notes (Unsigned)
   Follow-Up Visit   Subjective  Jose Mosley is a 52 y.o. male who presents for the following: Skin Cancer Screening and Full Body Skin Exam The patient presents for Total-Body Skin Exam (TBSE) for skin cancer screening and mole check. The patient has spots, moles and lesions to be evaluated, some may be new or changing and the patient has concerns that these could be cancer.  The following portions of the chart were reviewed this encounter and updated as appropriate: medications, allergies, medical history  Review of Systems:  No other skin or systemic complaints except as noted in HPI or Assessment and Plan.  Objective  Well appearing patient in no apparent distress; mood and affect are within normal limits.  A full examination was performed including scalp, head, eyes, ears, nose, lips, neck, chest, axillae, abdomen, back, buttocks, bilateral upper extremities, bilateral lower extremities, hands, feet, fingers, toes, fingernails, and toenails. All findings within normal limits unless otherwise noted below.     Assessment & Plan    Lentigines, Seborrheic Keratoses, Hemangiomas - Benign normal skin lesions - Benign-appearing - Call for any changes  Melanocytic Nevi - Tan-brown and/or pink-flesh-colored symmetric macules and papules - Benign appearing on exam today - Observation - Call clinic for new or changing moles - Recommend daily use of broad spectrum spf 30+ sunscreen to sun-exposed areas.   Actinic Damage - Chronic condition, secondary to cumulative UV/sun exposure - diffuse scaly erythematous macules with underlying dyspigmentation - Recommend daily broad spectrum sunscreen SPF 30+ to sun-exposed areas, reapply every 2 hours as needed.  - Staying in the shade or wearing long sleeves, sun glasses (UVA+UVB protection) and wide brim hats (4-inch brim around the entire circumference of the hat) are also recommended for sun protection.  - Call for new or changing  lesions.  INFLAMED SEBORRHEIC KERATOSIS  Symptomatic, irritating, patient would like treated. RTC if not resolved in 6 wks.  Benign-appearing.  Call clinic for new or changing lesions.   Prior to procedure, discussed risks of blister formation, small wound, skin dyspigmentation, or rare scar following treatment. Recommend Vaseline ointment to treated areas while healing.  Destruction Procedure Note Destruction method: cryotherapy   Informed consent: discussed and consent obtained   Lesion destroyed using liquid nitrogen: Yes   Outcome: patient tolerated procedure well with no complications   Post-procedure details: wound care instructions given   Locations: R lat sup forehead x 1 # of Lesions Treated: 1  Dermatofibroma - R prox tricep - Firm pink/brown papulenodule with dimple sign - Benign appearing - Call for any changes  Scar - R arm - Dyspigmented smooth macule or patch. - Benign-appearing.  Observation.  Call clinic for new or changing lesions. Recommend daily broad spectrum sunscreen SPF 30+, reapply every 2 hours as needed. - Recommend Serica moisturizing scar formula cream every night or [facility] brand or Mederma silicone scar sheet every night for the first year after a scar appears to help with scar remodeling if desired. Scars remodel on their own for a full year and will gradually improve in appearance over time.  Skin cancer screening performed today.  Return in about 1 year (around 07/20/2023) for TBSE - hx AK.  Luther Redo, CMA, am acting as scribe for Sarina Ser, MD .  Documentation: I have reviewed the above documentation for accuracy and completeness, and I agree with the above.  Sarina Ser, MD

## 2022-07-21 ENCOUNTER — Encounter: Payer: Self-pay | Admitting: Dermatology

## 2022-10-14 ENCOUNTER — Ambulatory Visit (INDEPENDENT_AMBULATORY_CARE_PROVIDER_SITE_OTHER): Admitting: Nurse Practitioner

## 2022-10-14 ENCOUNTER — Encounter: Payer: Self-pay | Admitting: Nurse Practitioner

## 2022-10-14 VITALS — BP 122/78 | HR 65 | Temp 98.3°F | Ht 68.0 in | Wt 182.4 lb

## 2022-10-14 DIAGNOSIS — Z0000 Encounter for general adult medical examination without abnormal findings: Secondary | ICD-10-CM

## 2022-10-14 DIAGNOSIS — I10 Essential (primary) hypertension: Secondary | ICD-10-CM

## 2022-10-14 DIAGNOSIS — E7800 Pure hypercholesterolemia, unspecified: Secondary | ICD-10-CM

## 2022-10-14 LAB — URINALYSIS, ROUTINE W REFLEX MICROSCOPIC
Bilirubin, UA: NEGATIVE
Glucose, UA: NEGATIVE
Leukocytes,UA: NEGATIVE
Nitrite, UA: NEGATIVE
Protein,UA: NEGATIVE
RBC, UA: NEGATIVE
Specific Gravity, UA: 1.02 (ref 1.005–1.030)
Urobilinogen, Ur: 0.2 mg/dL (ref 0.2–1.0)
pH, UA: 5.5 (ref 5.0–7.5)

## 2022-10-14 MED ORDER — LOSARTAN POTASSIUM 25 MG PO TABS: 25.0000 mg | ORAL_TABLET | Freq: Every day | ORAL | 1 refills | Status: DC

## 2022-10-14 MED ORDER — EZETIMIBE 10 MG PO TABS: 10.0000 mg | ORAL_TABLET | Freq: Every day | ORAL | 1 refills | Status: DC

## 2022-10-14 NOTE — Assessment & Plan Note (Signed)
Chronic.  Has been on rosuvastatin. Would like to switch to Zetia.  Discussed benefits of Rosuvastatin over Zetia.  Patient would like to try medication for 6 months.  Will repeat labs in 6 months.

## 2022-10-14 NOTE — Assessment & Plan Note (Signed)
Chronic. Well controlled.  Patient requesting to switch to alternative medication due to feeling like he has something in his throat. Will switch from Lisinopril to Losartan.  Follow up in 6 months.  Call sooner if concerns arise.

## 2022-10-14 NOTE — Progress Notes (Signed)
BP 122/78   Pulse 65   Temp 98.3 F (36.8 C) (Oral)   Ht 5\' 8"  (1.727 m)   Wt 182 lb 6.4 oz (82.7 kg)   SpO2 96%   BMI 27.73 kg/m    Subjective:    Patient ID: Jose Mosley, male    DOB: 1971-03-02, 52 y.o.   MRN: [id number]  HPI: Jose Mosley is a 52 y.o. male presenting on 10/14/2022 for comprehensive medical examination. Current medical complaints include:none  He currently lives with: Interim Problems from his last visit: no  HYPERTENSION / HYPERLIPIDEMIA Satisfied with current treatment? no Duration of hypertension: years BP monitoring frequency: not checking BP range:  BP medication side effects: no Past BP meds: lisinopril Duration of hyperlipidemia: years Cholesterol medication side effects: no Cholesterol supplements: none Past cholesterol medications: rosuvastatin (crestor) Medication compliance: excellent compliance Aspirin: no Recent stressors: no Recurrent headaches: no Visual changes: no Palpitations: no Dyspnea: no Chest pain: no Lower extremity edema: no Dizzy/lightheaded: no  The 10-year ASCVD risk score (Arnett DK, et al., 2019) is: 2.5%   Values used to calculate the score:     Age: 62 years     Sex: Male     Is Non-Hispanic African American: No     Diabetic: No     Tobacco smoker: No     Systolic Blood Pressure: 122 mmHg     Is BP treated: Yes     HDL Cholesterol: 71 mg/dL     Total Cholesterol: 166 mg/dL    Depression Screen done today and results listed below:     10/14/2022   10:31 AM 01/12/2022    1:30 PM 12/05/2021    9:41 AM 07/08/2020    1:53 PM 04/05/2019   10:07 AM  Depression screen PHQ 2/9  Decreased Interest 0 0 0 0 0  Down, Depressed, Hopeless 0 0 0 0 0  PHQ - 2 Score 0 0 0 0 0  Altered sleeping 0 0 0  0  Tired, decreased energy 0 0 0  0  Change in appetite 0 0 0  0  Feeling bad or failure about yourself  0 0 0  0  Trouble concentrating 0 0 0  0  Moving slowly or fidgety/restless 0 0 0  0  Suicidal  thoughts 0 0 0  0  PHQ-9 Score 0 0 0  0  Difficult doing work/chores Not difficult at all Not difficult at all Not difficult at all      The patient does not have a history of falls. I did complete a risk assessment for falls. A plan of care for falls was documented.   Past Medical History:  Past Medical History:  Diagnosis Date   Hyperlipidemia     Surgical History:  Past Surgical History:  Procedure Laterality Date   COLONOSCOPY WITH PROPOFOL N/A 08/21/2020   Procedure: COLONOSCOPY WITH PROPOFOL;  Surgeon: Pasty Spillers, MD;  Location: [facility] ENDOSCOPY;  Service: Endoscopy;  Laterality: N/A;   DISTAL BICEPS TENDON REPAIR Left 11/10/2019   FINGER SURGERY      Medications:  No current outpatient medications on file prior to visit.   No current facility-administered medications on file prior to visit.    Allergies:  No Known Allergies  Social History:  Social History   Socioeconomic History   Marital status: Married    Spouse name: Not on file   Number of children: Not on file   Years of education: Not on file  Highest education level: Not on file  Occupational History   Not on file  Tobacco Use   Smoking status: Former    Types: Cigarettes    Quit date: 03/06/1990    Years since quitting: 32.6   Smokeless tobacco: Never  Vaping Use   Vaping Use: Never used  Substance and Sexual Activity   Alcohol use: No    Alcohol/week: 0.0 standard drinks of alcohol   Drug use: No   Sexual activity: Yes  Other Topics Concern   Not on file  Social History Narrative   Not on file   Social Determinants of Health   Financial Resource Strain: Not on file  Food Insecurity: Not on file  Transportation Needs: Not on file  Physical Activity: Not on file  Stress: Not on file  Social Connections: Not on file  Intimate Partner Violence: Not on file   Social History   Tobacco Use  Smoking Status Former   Types: Cigarettes   Quit date: 03/06/1990   Years since  quitting: 32.6  Smokeless Tobacco Never   Social History   Substance and Sexual Activity  Alcohol Use No   Alcohol/week: 0.0 standard drinks of alcohol    Family History:  Family History  Problem Relation Age of Onset   Alcohol abuse Mother    Diabetes Mother    Heart disease Mother    Hyperlipidemia Mother    Stroke Mother    Heart disease Father    Hyperlipidemia Father    Hypertension Father    Stroke Father     Past medical history, surgical history, medications, allergies, family history and social history reviewed with patient today and changes made to appropriate areas of the chart.   Review of Systems  Constitutional:  Positive for malaise/fatigue.  Eyes:  Negative for blurred vision and double vision.  Respiratory:  Negative for shortness of breath.   Cardiovascular:  Negative for chest pain, palpitations and leg swelling.  Neurological:  Negative for dizziness and headaches.   All other ROS negative except what is listed above and in the HPI.      Objective:    BP 122/78   Pulse 65   Temp 98.3 F (36.8 C) (Oral)   Ht 5\' 8"  (1.727 m)   Wt 182 lb 6.4 oz (82.7 kg)   SpO2 96%   BMI 27.73 kg/m   Wt Readings from Last 3 Encounters:  10/14/22 182 lb 6.4 oz (82.7 kg)  01/12/22 167 lb 3.2 oz (75.8 kg)  12/05/21 165 lb 4.8 oz (75 kg)    Physical Exam Vitals and nursing note reviewed.  Constitutional:      General: He is not in acute distress.    Appearance: Normal appearance. He is not ill-appearing, toxic-appearing or diaphoretic.  HENT:     Head: Normocephalic.     Right Ear: Tympanic membrane, ear canal and external ear normal.     Left Ear: Tympanic membrane, ear canal and external ear normal.     Nose: Nose normal. No congestion or rhinorrhea.     Mouth/Throat:     Mouth: Mucous membranes are moist.  Eyes:     General:        Right eye: No discharge.        Left eye: No discharge.     Extraocular Movements: Extraocular movements intact.      Conjunctiva/sclera: Conjunctivae normal.     Pupils: Pupils are equal, round, and reactive to light.  Cardiovascular:  Rate and Rhythm: Normal rate and regular rhythm.     Heart sounds: No murmur heard. Pulmonary:     Effort: Pulmonary effort is normal. No respiratory distress.     Breath sounds: Normal breath sounds. No wheezing, rhonchi or rales.  Abdominal:     General: Abdomen is flat. Bowel sounds are normal. There is no distension.     Palpations: Abdomen is soft.     Tenderness: There is no abdominal tenderness. There is no guarding.  Musculoskeletal:     Cervical back: Normal range of motion and neck supple.  Skin:    General: Skin is warm and dry.     Capillary Refill: Capillary refill takes less than 2 seconds.  Neurological:     General: No focal deficit present.     Mental Status: He is alert and oriented to person, place, and time.     Cranial Nerves: No cranial nerve deficit.     Motor: No weakness.     Deep Tendon Reflexes: Reflexes normal.  Psychiatric:        Mood and Affect: Mood normal.        Behavior: Behavior normal.        Thought Content: Thought content normal.        Judgment: Judgment normal.     Results for orders placed or performed in visit on 12/05/21  Microscopic Examination   Urine  Result Value Ref Range   WBC, UA None seen 0 - 5 /hpf   RBC, Urine 0-2 0 - 2 /hpf   Epithelial Cells (non renal) None seen 0 - 10 /hpf   Mucus, UA Present (A) Not Estab.   Bacteria, UA None seen None seen/Few  Urinalysis, Routine w reflex microscopic  Result Value Ref Range   Specific Gravity, UA >1.030 (H) 1.005 - 1.030   pH, UA 6.0 5.0 - 7.5   Color, UA Yellow Yellow   Appearance Ur Clear Clear   Leukocytes,UA Negative Negative   Protein,UA 1+ (A) Negative/Trace   Glucose, UA Negative Negative   Ketones, UA Negative Negative   RBC, UA Trace (A) Negative   Bilirubin, UA Negative Negative   Urobilinogen, Ur 1.0 0.2 - 1.0 mg/dL   Nitrite, UA Negative  Negative   Microscopic Examination See below:   PSA  Result Value Ref Range   Prostate Specific Ag, Serum 1.5 0.0 - 4.0 ng/mL      Assessment & Plan:   Problem List Items Addressed This Visit       Cardiovascular and Mediastinum   Essential hypertension    Chronic. Well controlled.  Patient requesting to switch to alternative medication due to feeling like he has something in his throat. Will switch from Lisinopril to Losartan.  Follow up in 6 months.  Call sooner if concerns arise.       Relevant Medications   ezetimibe (ZETIA) 10 MG tablet   losartan (COZAAR) 25 MG tablet     Other   Pure hypercholesterolemia    Chronic.  Has been on rosuvastatin. Would like to switch to Zetia.  Discussed benefits of Rosuvastatin over Zetia.  Patient would like to try medication for 6 months.  Will repeat labs in 6 months.        Relevant Medications   ezetimibe (ZETIA) 10 MG tablet   losartan (COZAAR) 25 MG tablet   Other Relevant Orders   Lipid panel   Apo A1 + B + Ratio   Lipoprotein A (LPA)  Testosterone, free, total([facility])   HgB A1c   Insulin, Free and Total   CT CARDIAC SCORING (SELF PAY ONLY)   Other Visit Diagnoses     Annual physical exam    -  Primary   Health maintenance reviewed during visit today.  Labs ordered.  Vaccines reviewed.  Due for Colonoscopy next year. Would like Calcium scoring test- ordered.  Patient requested additional labs to be drawn at visit today.  Aware that insurance may not cover them.   Relevant Orders   TSH   PSA   Lipid panel   CBC with Differential/Platelet   Comprehensive metabolic panel   Urinalysis, Routine w reflex microscopic        Discussed aspirin prophylaxis for myocardial infarction prevention and decision was it was not indicated  LABORATORY TESTING:  Health maintenance labs ordered today as discussed above.   The natural history of prostate cancer and ongoing controversy regarding screening and potential  treatment outcomes of prostate cancer has been discussed with the patient. The meaning of a false positive PSA and a false negative PSA has been discussed. He indicates understanding of the limitations of this screening test and wishes to proceed with screening PSA testing.   IMMUNIZATIONS:   - Tdap: Tetanus vaccination status reviewed: last tetanus booster within 10 years. - Influenza: Refused - Pneumovax: Not applicable - Prevnar: Not applicable - HPV: Not applicable - Zostavax vaccine: Refused  SCREENING: - Colonoscopy: Up to date  Discussed with patient purpose of the colonoscopy is to detect colon cancer at curable precancerous or early stages   - AAA Screening: Not applicable  -Hearing Test: Not applicable  -Spirometry: Not applicable   PATIENT COUNSELING:    Sexuality: Discussed sexually transmitted diseases, partner selection, use of condoms, avoidance of unintended pregnancy  and contraceptive alternatives.   Advised to avoid cigarette smoking.  I discussed with the patient that most people either abstain from alcohol or drink within safe limits (<=14/week and <=4 drinks/occasion for males, <=7/weeks and <= 3 drinks/occasion for females) and that the risk for alcohol disorders and other health effects rises proportionally with the number of drinks per week and how often a drinker exceeds daily limits.  Discussed cessation/primary prevention of drug use and availability of treatment for abuse.   Diet: Encouraged to adjust caloric intake to maintain  or achieve ideal body weight, to reduce intake of dietary saturated fat and total fat, to limit sodium intake by avoiding high sodium foods and not adding table salt, and to maintain adequate dietary potassium and calcium preferably from fresh fruits, vegetables, and low-fat dairy products.    stressed the importance of regular exercise  Injury prevention: Discussed safety belts, safety helmets, smoke detector, smoking near  bedding or upholstery.   Dental health: Discussed importance of regular tooth brushing, flossing, and dental visits.   Follow up plan: NEXT PREVENTATIVE PHYSICAL DUE IN 1 YEAR. Return in about 6 months (around 04/15/2023) for HTN, HLD, DM2 FU.

## 2022-10-15 LAB — CBC WITH DIFFERENTIAL/PLATELET
Basophils Absolute: 0 10*3/uL (ref 0.0–0.2)
Immature Granulocytes: 0 %
MCV: 91 fL (ref 79–97)
Neutrophils Absolute: 3.2 10*3/uL (ref 1.4–7.0)
RDW: 12.7 % (ref 11.6–15.4)

## 2022-10-15 LAB — APO A1 + B + RATIO: Apolipoprotein B: 69 mg/dL (ref <90)

## 2022-10-15 LAB — PSA: Prostate Specific Ag, Serum: 1.1 ng/mL (ref 0.0–4.0)

## 2022-10-15 LAB — HEMOGLOBIN A1C
Est. average glucose Bld gHb Est-mCnc: 108 mg/dL
Hgb A1c MFr Bld: 5.4 % (ref 4.8–5.6)

## 2022-10-15 LAB — LIPID PANEL: Triglycerides: 64 mg/dL (ref 0–149)

## 2022-10-15 LAB — COMPREHENSIVE METABOLIC PANEL: Glucose: 82 mg/dL (ref 70–99)

## 2022-10-16 LAB — COMPREHENSIVE METABOLIC PANEL
Alkaline Phosphatase: 52 IU/L (ref 44–121)
BUN: 13 mg/dL (ref 6–24)
Creatinine, Ser: 0.99 mg/dL (ref 0.76–1.27)
Sodium: 139 mmol/L (ref 134–144)
eGFR: 92 mL/min/{1.73_m2} (ref >59)

## 2022-10-16 LAB — CBC WITH DIFFERENTIAL/PLATELET
Basos: 1 %
Hematocrit: 50.8 % (ref 37.5–51.0)
Hemoglobin: 17 g/dL (ref 13.0–17.7)
RBC: 5.59 x10E6/uL (ref 4.14–5.80)

## 2022-10-16 LAB — TESTOSTERONE, FREE, TOTAL, SHBG: Sex Hormone Binding: 32.8 nmol/L (ref 19.3–76.4)

## 2022-10-16 LAB — LIPOPROTEIN A (LPA): Lipoprotein (a): 66.9 nmol/L (ref <75.0)

## 2022-10-16 LAB — LIPID PANEL: Cholesterol, Total: 166 mg/dL (ref 100–199)

## 2022-10-16 LAB — APO A1 + B + RATIO: Apolipo. B/A-1 Ratio: 0.4 ratio (ref 0.0–0.7)

## 2022-10-16 NOTE — Progress Notes (Signed)
Hi Jose Mosley.  Your labs are unremarkable.  Your insulin hasn't come back yet.  I will send you another message once it does return.  We will recheck the cholesterol in 6 months after starting the Zetia.

## 2022-10-17 LAB — INSULIN, FREE AND TOTAL

## 2022-10-17 LAB — CBC WITH DIFFERENTIAL/PLATELET
Monocytes: 7 %
Neutrophils: 65 %
Platelets: 219 10*3/uL (ref 150–450)

## 2022-10-17 LAB — LIPID PANEL
Chol/HDL Ratio: 2.2 ratio (ref 0.0–5.0)
HDL: 77 mg/dL (ref >39)
LDL Chol Calc (NIH): 76 mg/dL (ref 0–99)

## 2022-10-17 LAB — COMPREHENSIVE METABOLIC PANEL
AST: 21 IU/L (ref 0–40)
Albumin: 4.7 g/dL (ref 3.8–4.9)
Bilirubin Total: 0.9 mg/dL (ref 0.0–1.2)

## 2022-10-23 LAB — APO A1 + B + RATIO: Apolipoprotein A-1: 179 mg/dL — ABNORMAL HIGH (ref 101–178)

## 2022-10-23 LAB — COMPREHENSIVE METABOLIC PANEL
ALT: 32 IU/L (ref 0–44)
BUN/Creatinine Ratio: 13 (ref 9–20)
CO2: 20 mmol/L (ref 20–29)
Calcium: 9.4 mg/dL (ref 8.7–10.2)
Chloride: 103 mmol/L (ref 96–106)
Globulin, Total: 2.1 g/dL (ref 1.5–4.5)
Potassium: 5 mmol/L (ref 3.5–5.2)
Total Protein: 6.8 g/dL (ref 6.0–8.5)

## 2022-10-23 LAB — CBC WITH DIFFERENTIAL/PLATELET
EOS (ABSOLUTE): 0.1 10*3/uL (ref 0.0–0.4)
Eos: 2 %
Immature Grans (Abs): 0 10*3/uL (ref 0.0–0.1)
Lymphocytes Absolute: 1.2 10*3/uL (ref 0.7–3.1)
Lymphs: 25 %
MCH: 30.4 pg (ref 26.6–33.0)
MCHC: 33.5 g/dL (ref 31.5–35.7)
Monocytes Absolute: 0.3 10*3/uL (ref 0.1–0.9)
WBC: 4.9 10*3/uL (ref 3.4–10.8)

## 2022-10-23 LAB — LIPID PANEL: VLDL Cholesterol Cal: 13 mg/dL (ref 5–40)

## 2022-10-23 LAB — INSULIN, FREE AND TOTAL: Total Insulin: 4.3 uU/mL

## 2022-10-23 LAB — TESTOSTERONE, FREE, TOTAL, SHBG: Testosterone: 366 ng/dL (ref 264–916)

## 2022-10-23 LAB — TSH: TSH: 0.917 u[IU]/mL (ref 0.450–4.500)

## 2022-11-02 ENCOUNTER — Encounter: Payer: Self-pay | Admitting: Nurse Practitioner

## 2022-11-07 NOTE — Patient Instructions (Addendum)
Pitcher's Elbow  Pitcher's elbow is a type of elbow injury that develops slowly over time (overuse injury). This condition is also called valgus extension overload syndrome (VEOS). This injury is common in athletes who make repeated overhead throwing motions, such as a baseball pitcher throwing a ball. Throwing motions can: Overstretch the strong band of tissue (ligament) on the inside of the elbow (ulnar collateral ligament, or UCL). UCL injuries can range from minor inflammation to a complete ligament tear. Push the bones at the outside and back of the elbow together (compression). These forces can injure the ligament over time and create an abnormal extra bone in the elbow (osteophyte or bone spur). What are the causes? This condition may be caused by: Repeated overhead throwing, such as baseball pitching. Improper throwing motion. Tightness in the shoulder that puts added demand on the elbow. What increases the risk? This condition is more likely to develop in athletes who play sports that involve repeated forceful straightening of the elbow, such as: Baseball or softball. Tennis and other racquet sports. Football. Lacrosse. Gymnastics. Javelin. What are the signs or symptoms? Symptoms of this condition include: Elbow pain. Increased pain in your elbow as you straighten your arm forcefully, such as when throwing. Swelling. Limited range of motion or "locking" of the elbow. A feeling like a pop or a tear in your elbow. How is this diagnosed? This condition is diagnosed based on: Your symptoms and medical history. A physical exam. Your health care provider may: Check your elbow's strength, stability, and range of motion. Compare your injured elbow to your other elbow. Gently press your arm and elbow to find the source of pain. Imaging tests, such as: X-rays or CT scans to check for stress fractures and bone spurs. Ultrasound or MRI to check for tears in the ligaments or  tendons. How is this treated? Treatment for this condition may include: Stopping activities that require overhead arm motions, then returning slowly to full activities. Making changes to your sports technique to decrease elbow strain. This may include wearing a brace. Taking medicine for pain. Injecting medicines (corticosteroids) into your elbow to reduce swelling and pain. Doing exercises to improve movement and strength (physical therapy). Surgery. This may be needed if all other treatments do not work. It may involve: Repairing damaged ligaments. Removing abnormal bone growths. Removing pieces of bone or cartilage. After surgery, you will have to wear a brace for several weeks and do physical therapy. Follow these instructions at home: If you have a removable brace: Wear the brace as told by your provider. Remove it only as told by your provider. Check the skin around the brace every day. Tell your provider about any concerns. Loosen the brace if your fingers tingle, become numb, or turn cold and blue. Keep the brace clean. If the brace is not waterproof: Do not let it get wet. Cover it with a watertight covering when you take a bath or shower. Managing pain, stiffness, and swelling  If told, put ice on the injured area. If you have a removable brace, remove it as told by your provider. Put ice in a plastic bag. Place a towel between your skin and the bag. Leave the ice on for 20 minutes, 2-3 times a day. If your skin turns bright red, remove the ice right away to prevent skin damage. The risk of damage is higher if you cannot feel pain, heat, or cold. Move your fingers often to reduce stiffness and swelling. Raise (elevate) the injured   area above the level of your heart while you are sitting or lying down. Activity Rest your elbow and avoid activities that require overhead arm motions as told by your provider. Return to your normal activities as told by your provider. Ask your  provider what activities are safe for you. Do exercises as told by your provider. General instructions Do not use any products that contain nicotine or tobacco. These products include cigarettes, chewing tobacco, and vaping devices, such as e-cigarettes. These can delay healing. If you need help quitting, ask your provider. Take over-the-counter and prescription medicines only as told by your provider. Keep all follow-up visits. Your provider will monitor your injury and activity. How is this prevented? Warm up and stretch before being active. Cool down and stretch after being active. Give your body time to rest between periods of activity. Exercise to improve your physical fitness, including: Strength. Flexibility. Have your throwing motion checked to make sure you use proper form. Rest your elbow if you show signs of tiredness (fatigue). When you start any new sports activity, increase how much you do slowly. Follow the rules for your sport on how often and how much you can throw in a game. Avoid overhead throwing motions as told by your provider. Contact a health care provider if: Your pain does not improve or it gets worse after 2-4 weeks of rest. Get help right away if: Your pain is severe. You cannot move your arm or elbow. This information is not intended to replace advice given to you by your health care provider. Make sure you discuss any questions you have with your health care provider. Document Revised: 12/24/2021 Document Reviewed: 12/24/2021 Elsevier Patient Education  2024 [employer]  

## 2022-11-09 ENCOUNTER — Ambulatory Visit: Admitting: Nurse Practitioner

## 2022-11-09 ENCOUNTER — Encounter: Payer: Self-pay | Admitting: Nurse Practitioner

## 2022-11-09 VITALS — BP 124/80 | HR 66 | Temp 98.2°F | Ht 67.99 in | Wt 178.8 lb

## 2022-11-09 DIAGNOSIS — M7712 Lateral epicondylitis, left elbow: Secondary | ICD-10-CM

## 2022-11-09 DIAGNOSIS — M7710 Lateral epicondylitis, unspecified elbow: Secondary | ICD-10-CM | POA: Insufficient documentation

## 2022-11-09 NOTE — Assessment & Plan Note (Signed)
Acute for 3 weeks with no improvement using simple methods and rest. Discussed at length with patient today, he is requesting steroid injection.  Educated him on this, risks and benefits.  Has had before to shoulder.  Provided lateral elbow injection in office today without issue.  He is aware of ER precautions.  Recommend he ice area every 3-4 hours for the next 24 hours and rest.  Tylenol as needed.  Return if ongoing or worsening pain, if present will send to ortho.

## 2022-11-09 NOTE — Progress Notes (Signed)
BP 124/80   Pulse 66   Temp 98.2 F (36.8 C) (Oral)   Ht 5' 7.99" (1.727 m)   Wt 178 lb 12.8 oz (81.1 kg)   SpO2 97%   BMI 27.19 kg/m    Subjective:    Patient ID: Jose Mosley, male    DOB: 02/06/1971, 52 y.o.   MRN: [id number]  HPI: Jose Mosley is a 52 y.o. male  Chief Complaint  Patient presents with   Elbow Pain    Started about 3 weeks ago   ELBOW PAIN Started 3 weeks ago to left elbow, was working out a lot.  Had a bicep tear to this arm about 4 years ago, had surgery at the time. Present to lateral aspect.   Duration: weeks Location: elbow left Mechanism of injury: unknown Onset: sudden Severity: 2/10 today, can be 5-6/10 Quality:  sharp, aching, and throbbing Frequency: intermittent Radiation: no Aggravating factors: bending and movement  Alleviating factors: rest  Status: fluctuating Treatments attempted: rest  Relief with NSAIDs?:  No NSAIDs Taken Swelling: no Redness: no  Warmth: no Trauma: no Chest pain: no  Shortness of breath: no  Fever: no Decreased sensation: no Paresthesias: no Weakness: no   Relevant past medical, surgical, family and social history reviewed and updated as indicated. Interim medical history since our last visit reviewed. Allergies and medications reviewed and updated.  Review of Systems  Constitutional:  Negative for activity change, appetite change, chills, diaphoresis, fatigue and fever.  Respiratory:  Negative for cough, chest tightness, shortness of breath and wheezing.   Cardiovascular:  Negative for chest pain, palpitations and leg swelling.  Musculoskeletal:  Positive for arthralgias.  Neurological: Negative.   Psychiatric/Behavioral: Negative.      Per HPI unless specifically indicated above     Objective:    BP 124/80   Pulse 66   Temp 98.2 F (36.8 C) (Oral)   Ht 5' 7.99" (1.727 m)   Wt 178 lb 12.8 oz (81.1 kg)   SpO2 97%   BMI 27.19 kg/m   Wt Readings from Last 3 Encounters:  11/09/22  178 lb 12.8 oz (81.1 kg)  10/14/22 182 lb 6.4 oz (82.7 kg)  01/12/22 167 lb 3.2 oz (75.8 kg)    Physical Exam Vitals and nursing note reviewed.  Constitutional:      General: He is awake. He is not in acute distress.    Appearance: He is well-developed and well-groomed. He is not ill-appearing or toxic-appearing.  HENT:     Head: Normocephalic.     Right Ear: Hearing and external ear normal.     Left Ear: Hearing and external ear normal.  Eyes:     General: Lids are normal.     Extraocular Movements: Extraocular movements intact.     Conjunctiva/sclera: Conjunctivae normal.  Neck:     Vascular: No carotid bruit.  Cardiovascular:     Rate and Rhythm: Normal rate and regular rhythm.     Heart sounds: Normal heart sounds. No murmur heard.    No gallop.  Pulmonary:     Effort: Pulmonary effort is normal. No accessory muscle usage or respiratory distress.     Breath sounds: Normal breath sounds.  Abdominal:     General: Bowel sounds are normal. There is no distension.     Palpations: Abdomen is soft.     Tenderness: There is no abdominal tenderness.  Musculoskeletal:     Right elbow: Normal.     Left elbow: No swelling, effusion  or lacerations. Normal range of motion. Tenderness present in lateral epicondyle.     Cervical back: Full passive range of motion without pain.     Right lower leg: No edema.     Left lower leg: No edema.  Skin:    General: Skin is warm.     Capillary Refill: Capillary refill takes less than 2 seconds.  Neurological:     Mental Status: He is alert and oriented to person, place, and time.     Deep Tendon Reflexes: Reflexes are normal and symmetric.     Reflex Scores:      Brachioradialis reflexes are 2+ on the right side and 2+ on the left side.      Patellar reflexes are 2+ on the right side and 2+ on the left side. Psychiatric:        Attention and Perception: Attention normal.        Mood and Affect: Mood normal.        Speech: Speech normal.         Behavior: Behavior normal. Behavior is cooperative.        Thought Content: Thought content normal.   STEROID INJECTION  Procedure: Left lateral Intraarticular Steroid Injection  Description: After verbal consent and patient education on procedure, area prepped and draped using semi-sterile technique. Using a lateral approach, a mixture of 2 cc of 1% Lido with epi & 1/2 cc of Kenalog 40 was injected into lateral left elbow joint without difficulty and no blood aspirated on check.  A bandage was then placed over the injection site. Complications:  none Post Procedure Instructions: To the ER if any symptoms of erythema or swelling.   Follow Up: PRN  Results for orders placed or performed in visit on 10/14/22  TSH  Result Value Ref Range   TSH 0.917 0.450 - 4.500 uIU/mL  PSA  Result Value Ref Range   Prostate Specific Ag, Serum 1.1 0.0 - 4.0 ng/mL  Lipid panel  Result Value Ref Range   Cholesterol, Total 166 100 - 199 mg/dL   Triglycerides 64 0 - 149 mg/dL   HDL 77 >14 mg/dL   VLDL Cholesterol Cal 13 5 - 40 mg/dL   LDL Chol Calc (NIH) 76 0 - 99 mg/dL   Chol/HDL Ratio 2.2 0.0 - 5.0 ratio  CBC with Differential/Platelet  Result Value Ref Range   WBC 4.9 3.4 - 10.8 x10E3/uL   RBC 5.59 4.14 - 5.80 x10E6/uL   Hemoglobin 17.0 13.0 - 17.7 g/dL   Hematocrit 78.2 95.6 - 51.0 %   MCV 91 79 - 97 fL   MCH 30.4 26.6 - 33.0 pg   MCHC 33.5 31.5 - 35.7 g/dL   RDW 21.3 08.6 - 57.8 %   Platelets 219 150 - 450 x10E3/uL   Neutrophils 65 Not Estab. %   Lymphs 25 Not Estab. %   Monocytes 7 Not Estab. %   Eos 2 Not Estab. %   Basos 1 Not Estab. %   Neutrophils Absolute 3.2 1.4 - 7.0 x10E3/uL   Lymphocytes Absolute 1.2 0.7 - 3.1 x10E3/uL   Monocytes Absolute 0.3 0.1 - 0.9 x10E3/uL   EOS (ABSOLUTE) 0.1 0.0 - 0.4 x10E3/uL   Basophils Absolute 0.0 0.0 - 0.2 x10E3/uL   Immature Granulocytes 0 Not Estab. %   Immature Grans (Abs) 0.0 0.0 - 0.1 x10E3/uL  Comprehensive metabolic panel  Result  Value Ref Range   Glucose 82 70 - 99 mg/dL   BUN 13 6 -  24 mg/dL   Creatinine, Ser 1.61 0.76 - 1.27 mg/dL   eGFR 92 >09 UE/AVW/0.98   BUN/Creatinine Ratio 13 9 - 20   Sodium 139 134 - 144 mmol/L   Potassium 5.0 3.5 - 5.2 mmol/L   Chloride 103 96 - 106 mmol/L   CO2 20 20 - 29 mmol/L   Calcium 9.4 8.7 - 10.2 mg/dL   Total Protein 6.8 6.0 - 8.5 g/dL   Albumin 4.7 3.8 - 4.9 g/dL   Globulin, Total 2.1 1.5 - 4.5 g/dL   Bilirubin Total 0.9 0.0 - 1.2 mg/dL   Alkaline Phosphatase 52 44 - 121 IU/L   AST 21 0 - 40 IU/L   ALT 32 0 - 44 IU/L  Urinalysis, Routine w reflex microscopic  Result Value Ref Range   Specific Gravity, UA 1.020 1.005 - 1.030   pH, UA 5.5 5.0 - 7.5   Color, UA Yellow Yellow   Appearance Ur Clear Clear   Leukocytes,UA Negative Negative   Protein,UA Negative Negative/Trace   Glucose, UA Negative Negative   Ketones, UA 2+ (A) Negative   RBC, UA Negative Negative   Bilirubin, UA Negative Negative   Urobilinogen, Ur 0.2 0.2 - 1.0 mg/dL   Nitrite, UA Negative Negative   Microscopic Examination Comment   Apo A1 + B + Ratio  Result Value Ref Range   Apolipoprotein A-1 179 (H) 101 - 178 mg/dL   Apolipoprotein B 69 <11 mg/dL   Apolipo. B/A-1 Ratio 0.4 0.0 - 0.7 ratio  Lipoprotein A (LPA)  Result Value Ref Range   Lipoprotein (a) 66.9 <75.0 nmol/L  Testosterone, free, total([facility])  Result Value Ref Range   Testosterone 366 264 - 916 ng/dL   Testosterone, Free 7.4 7.2 - 24.0 pg/mL   Sex Hormone Binding 32.8 19.3 - 76.4 nmol/L  HgB A1c  Result Value Ref Range   Hgb A1c MFr Bld 5.4 4.8 - 5.6 %   Est. average glucose Bld gHb Est-mCnc 108 mg/dL  Insulin, Free and Total  Result Value Ref Range   Free Insulin 4.3 uU/mL   Total Insulin 4.3 uU/mL      Assessment & Plan:   Problem List Items Addressed This Visit       Musculoskeletal and Integument   Lateral epicondylitis (tennis elbow) - Primary    Acute for 3 weeks with no improvement using simple  methods and rest. Discussed at length with patient today, he is requesting steroid injection.  Educated him on this, risks and benefits.  Has had before to shoulder.  Provided lateral elbow injection in office today without issue.  He is aware of ER precautions.  Recommend he ice area every 3-4 hours for the next 24 hours and rest.  Tylenol as needed.  Return if ongoing or worsening pain, if present will send to ortho.        Follow up plan: Return if symptoms worsen or fail to improve.

## 2022-11-17 ENCOUNTER — Ambulatory Visit
Admission: RE | Admit: 2022-11-17 | Discharge: 2022-11-17 | Disposition: A | Discharge status: Home / Self Care | Source: Ambulatory Visit | Attending: Nurse Practitioner | Admitting: Nurse Practitioner

## 2022-11-17 DIAGNOSIS — E7800 Pure hypercholesterolemia, unspecified: Secondary | ICD-10-CM | POA: Insufficient documentation

## 2022-11-18 NOTE — Progress Notes (Signed)
Contacted via MyChart   Good morning Jose Mosley, your screening returned and your coronary calcium score is 0 meaning you are considered low risk and recommendation is focus on healthy heart diet and regular activity to keep heart in good shape.:)

## 2023-01-01 ENCOUNTER — Ambulatory Visit

## 2023-01-04 ENCOUNTER — Ambulatory Visit

## 2023-01-06 ENCOUNTER — Ambulatory Visit

## 2023-03-30 NOTE — Progress Notes (Unsigned)
There were no vitals taken for this visit.   Subjective:    Patient ID: Jose Mosley, male    DOB: 05-Sep-1970, 52 y.o.   MRN: [id number]  HPI: Jose Mosley is a 52 y.o. male  No chief complaint on file.  HYPERTENSION / HYPERLIPIDEMIA Satisfied with current treatment? {Blank single:19197::"yes","no"} Duration of hypertension: {Blank single:19197::"chronic","months","years"} BP monitoring frequency: {Blank single:19197::"not checking","rarely","daily","weekly","monthly","a few times a day","a few times a week","a few times a month"} BP range:  BP medication side effects: {Blank single:19197::"yes","no"} Past BP meds: {Blank multiple:19196::"none","amlodipine","amlodipine/benazepril","atenolol","benazepril","benazepril/HCTZ","bisoprolol (bystolic)","carvedilol","chlorthalidone","clonidine","diltiazem","exforge HCT","HCTZ","irbesartan (avapro)","labetalol","lisinopril","lisinopril-HCTZ","losartan (cozaar)","methyldopa","nifedipine","olmesartan (benicar)","olmesartan-HCTZ","quinapril","ramipril","spironalactone","tekturna","valsartan","valsartan-HCTZ","verapamil"} Duration of hyperlipidemia: {Blank single:19197::"chronic","months","years"} Cholesterol medication side effects: {Blank single:19197::"yes","no"} Cholesterol supplements: {Blank multiple:19196::"none","fish oil","niacin","red yeast rice"} Past cholesterol medications: {Blank multiple:19196::"none","atorvastain (lipitor)","lovastatin (mevacor)","pravastatin (pravachol)","rosuvastatin (crestor)","simvastatin (zocor)","vytorin","fenofibrate (tricor)","gemfibrozil","ezetimide (zetia)","niaspan","lovaza"} Medication compliance: {Blank single:19197::"excellent compliance","good compliance","fair compliance","poor compliance"} Aspirin: {Blank single:19197::"yes","no"} Recent stressors: {Blank single:19197::"yes","no"} Recurrent headaches: {Blank single:19197::"yes","no"} Visual changes: {Blank  single:19197::"yes","no"} Palpitations: {Blank single:19197::"yes","no"} Dyspnea: {Blank single:19197::"yes","no"} Chest pain: {Blank single:19197::"yes","no"} Lower extremity edema: {Blank single:19197::"yes","no"} Dizzy/lightheaded: {Blank single:19197::"yes","no"}  Relevant past medical, surgical, family and social history reviewed and updated as indicated. Interim medical history since our last visit reviewed. Allergies and medications reviewed and updated.  Review of Systems  Per HPI unless specifically indicated above     Objective:    There were no vitals taken for this visit.  Wt Readings from Last 3 Encounters:  11/09/22 178 lb 12.8 oz (81.1 kg)  10/14/22 182 lb 6.4 oz (82.7 kg)  01/12/22 167 lb 3.2 oz (75.8 kg)    Physical Exam  Results for orders placed or performed in visit on 10/14/22  TSH  Result Value Ref Range   TSH 0.917 0.450 - 4.500 uIU/mL  PSA  Result Value Ref Range   Prostate Specific Ag, Serum 1.1 0.0 - 4.0 ng/mL  Lipid panel  Result Value Ref Range   Cholesterol, Total 166 100 - 199 mg/dL   Triglycerides 64 0 - 149 mg/dL   HDL 77 >78 mg/dL   VLDL Cholesterol Cal 13 5 - 40 mg/dL   LDL Chol Calc (NIH) 76 0 - 99 mg/dL   Chol/HDL Ratio 2.2 0.0 - 5.0 ratio  CBC with Differential/Platelet  Result Value Ref Range   WBC 4.9 3.4 - 10.8 x10E3/uL   RBC 5.59 4.14 - 5.80 x10E6/uL   Hemoglobin 17.0 13.0 - 17.7 g/dL   Hematocrit 46.9 62.9 - 51.0 %   MCV 91 79 - 97 fL   MCH 30.4 26.6 - 33.0 pg   MCHC 33.5 31.5 - 35.7 g/dL   RDW 52.8 41.3 - 24.4 %   Platelets 219 150 - 450 x10E3/uL   Neutrophils 65 Not Estab. %   Lymphs 25 Not Estab. %   Monocytes 7 Not Estab. %   Eos 2 Not Estab. %   Basos 1 Not Estab. %   Neutrophils Absolute 3.2 1.4 - 7.0 x10E3/uL   Lymphocytes Absolute 1.2 0.7 - 3.1 x10E3/uL   Monocytes Absolute 0.3 0.1 - 0.9 x10E3/uL   EOS (ABSOLUTE) 0.1 0.0 - 0.4 x10E3/uL   Basophils Absolute 0.0 0.0 - 0.2 x10E3/uL   Immature Granulocytes 0  Not Estab. %   Immature Grans (Abs) 0.0 0.0 - 0.1 x10E3/uL  Comprehensive metabolic panel  Result Value Ref Range   Glucose 82 70 - 99 mg/dL   BUN 13 6 - 24 mg/dL   Creatinine, Ser 0.10 0.76 - 1.27 mg/dL   eGFR 92 >27 OZ/DGU/4.40   BUN/Creatinine Ratio 13 9 - 20   Sodium 139 134 -  144 mmol/L   Potassium 5.0 3.5 - 5.2 mmol/L   Chloride 103 96 - 106 mmol/L   CO2 20 20 - 29 mmol/L   Calcium 9.4 8.7 - 10.2 mg/dL   Total Protein 6.8 6.0 - 8.5 g/dL   Albumin 4.7 3.8 - 4.9 g/dL   Globulin, Total 2.1 1.5 - 4.5 g/dL   Bilirubin Total 0.9 0.0 - 1.2 mg/dL   Alkaline Phosphatase 52 44 - 121 IU/L   AST 21 0 - 40 IU/L   ALT 32 0 - 44 IU/L  Urinalysis, Routine w reflex microscopic  Result Value Ref Range   Specific Gravity, UA 1.020 1.005 - 1.030   pH, UA 5.5 5.0 - 7.5   Color, UA Yellow Yellow   Appearance Ur Clear Clear   Leukocytes,UA Negative Negative   Protein,UA Negative Negative/Trace   Glucose, UA Negative Negative   Ketones, UA 2+ (A) Negative   RBC, UA Negative Negative   Bilirubin, UA Negative Negative   Urobilinogen, Ur 0.2 0.2 - 1.0 mg/dL   Nitrite, UA Negative Negative   Microscopic Examination Comment   Apo A1 + B + Ratio  Result Value Ref Range   Apolipoprotein A-1 179 (H) 101 - 178 mg/dL   Apolipoprotein B 69 <74 mg/dL   Apolipo. B/A-1 Ratio 0.4 0.0 - 0.7 ratio  Lipoprotein A (LPA)  Result Value Ref Range   Lipoprotein (a) 66.9 <75.0 nmol/L  Testosterone, free, total([facility])  Result Value Ref Range   Testosterone 366 264 - 916 ng/dL   Testosterone, Free 7.4 7.2 - 24.0 pg/mL   Sex Hormone Binding 32.8 19.3 - 76.4 nmol/L  HgB A1c  Result Value Ref Range   Hgb A1c MFr Bld 5.4 4.8 - 5.6 %   Est. average glucose Bld gHb Est-mCnc 108 mg/dL  Insulin, Free and Total  Result Value Ref Range   Free Insulin 4.3 uU/mL   Total Insulin 4.3 uU/mL      Assessment & Plan:   Problem List Items Addressed This Visit       Cardiovascular and Mediastinum    Essential hypertension - Primary     Other   Hyperlipidemia     Follow up plan: No follow-ups on file.

## 2023-03-31 ENCOUNTER — Encounter: Payer: Self-pay | Admitting: Nurse Practitioner

## 2023-03-31 ENCOUNTER — Ambulatory Visit: Admitting: Nurse Practitioner

## 2023-03-31 VITALS — BP 120/72 | HR 69 | Temp 97.9°F | Ht 68.0 in | Wt 171.4 lb

## 2023-03-31 DIAGNOSIS — I10 Essential (primary) hypertension: Secondary | ICD-10-CM | POA: Diagnosis not present

## 2023-03-31 DIAGNOSIS — E7800 Pure hypercholesterolemia, unspecified: Secondary | ICD-10-CM

## 2023-03-31 MED ORDER — LOSARTAN POTASSIUM 25 MG PO TABS: 25.0000 mg | ORAL_TABLET | Freq: Every day | ORAL | 1 refills | Status: DC

## 2023-03-31 MED ORDER — EZETIMIBE 10 MG PO TABS: 10.0000 mg | ORAL_TABLET | Freq: Every day | ORAL | 1 refills | Status: DC

## 2023-04-01 LAB — COMPREHENSIVE METABOLIC PANEL
ALT: 27 [IU]/L (ref 0–44)
AST: 19 [IU]/L (ref 0–40)
Albumin: 4.4 g/dL (ref 3.8–4.9)
Alkaline Phosphatase: 53 [IU]/L (ref 44–121)
BUN/Creatinine Ratio: 18 (ref 9–20)
BUN: 17 mg/dL (ref 6–24)
Bilirubin Total: 0.7 mg/dL (ref 0.0–1.2)
CO2: 24 mmol/L (ref 20–29)
Calcium: 9.2 mg/dL (ref 8.7–10.2)
Chloride: 103 mmol/L (ref 96–106)
Creatinine, Ser: 0.94 mg/dL (ref 0.76–1.27)
Globulin, Total: 2.1 g/dL (ref 1.5–4.5)
Glucose: 83 mg/dL (ref 70–99)
Potassium: 4.6 mmol/L (ref 3.5–5.2)
Sodium: 139 mmol/L (ref 134–144)
Total Protein: 6.5 g/dL (ref 6.0–8.5)
eGFR: 97 mL/min/{1.73_m2} (ref >59)

## 2023-04-01 LAB — LIPID PANEL
Chol/HDL Ratio: 2.9 {ratio} (ref 0.0–5.0)
Cholesterol, Total: 212 mg/dL — ABNORMAL HIGH (ref 100–199)
HDL: 74 mg/dL (ref >39)
LDL Chol Calc (NIH): 124 mg/dL — ABNORMAL HIGH (ref 0–99)
Triglycerides: 82 mg/dL (ref 0–149)
VLDL Cholesterol Cal: 14 mg/dL (ref 5–40)

## 2023-04-07 ENCOUNTER — Other Ambulatory Visit: Payer: Self-pay | Admitting: Nurse Practitioner

## 2023-04-07 NOTE — Telephone Encounter (Signed)
Reordered 03/31/23 #90 1 RF for both meds  Requested Prescriptions  Refused Prescriptions Disp Refills   losartan (COZAAR) 25 MG tablet [Pharmacy Med Name: LOSARTAN 25MG  TABLETS] 90 tablet 1    Sig: TAKE 1 TABLET(25 MG) BY MOUTH DAILY     Cardiovascular:  Angiotensin Receptor Blockers Passed - 04/07/2023  3:13 AM      Passed - Cr in normal range and within 180 days    Creatinine, Ser  Date Value Ref Range Status  03/31/2023 0.94 0.76 - 1.27 mg/dL Final         Passed - K in normal range and within 180 days    Potassium  Date Value Ref Range Status  03/31/2023 4.6 3.5 - 5.2 mmol/L Final         Passed - Patient is not pregnant      Passed - Last BP in normal range    BP Readings from Last 1 Encounters:  03/31/23 120/72         Passed - Valid encounter within last 6 months    Recent Outpatient Visits           1 week ago Essential hypertension   [place] [facility] Larae Grooms, NP   4 months ago Lateral epicondylitis of left elbow   [place] [facility] [place], Corrie Dandy T, NP   5 months ago Annual physical exam   [place] [facility] Larae Grooms, NP   1 year ago Acute prostatitis with hematuria   [place] [facility] [place], Corrie Dandy T, NP   1 year ago Urinary symptom or sign   [place] [facility] Marjie Skiff, NP       Future Appointments             In 3 months Deirdre Evener, MD [facility]   In 5 months Larae Grooms, NP  [facility]             ezetimibe (ZETIA) 10 MG tablet [Pharmacy Med Name: EZETIMIBE 10MG  TABLETS] 90 tablet 1    Sig: TAKE 1 TABLET(10 MG) BY MOUTH DAILY     Cardiovascular:  Antilipid - Sterol Transport Inhibitors Failed - 04/07/2023  3:13 AM      Failed - Lipid Panel in normal range within the last 12 months    Cholesterol, Total  Date Value Ref Range Status  03/31/2023 212 (H)  100 - 199 mg/dL Final   Cholesterol Piccolo, Waived  Date Value Ref Range Status  09/03/2016 176 <200 mg/dL Final    Comment:                            Desirable                <200                         Borderline High      200- 239                         High                     >239    LDL Chol Calc (NIH)  Date Value Ref Range Status  03/31/2023 124 (H) 0 - 99 mg/dL Final   HDL  Date Value Ref Range Status  03/31/2023 74 >39 mg/dL Final   Triglycerides  Date Value Ref Range Status  03/31/2023 82 0 - 149 mg/dL Final   Triglycerides Piccolo,Waived  Date Value Ref Range Status  09/03/2016 87 <150 mg/dL Final    Comment:                            Normal                   <150                         Borderline High     150 - 199                         High                200 - 499                         Very High                >499          Passed - AST in normal range and within 360 days    AST  Date Value Ref Range Status  03/31/2023 19 0 - 40 IU/L Final   AST (SGOT) Piccolo, Waived  Date Value Ref Range Status  09/03/2016 36 11 - 38 U/L Final         Passed - ALT in normal range and within 360 days    ALT  Date Value Ref Range Status  03/31/2023 27 0 - 44 IU/L Final   ALT (SGPT) Piccolo, Waived  Date Value Ref Range Status  09/03/2016 45 10 - 47 U/L Final         Passed - Patient is not pregnant      Passed - Valid encounter within last 12 months    Recent Outpatient Visits           1 week ago Essential hypertension   [place] [facility] Larae Grooms, NP   4 months ago Lateral epicondylitis of left elbow   [place] [facility] [place], Corrie Dandy T, NP   5 months ago Annual physical exam   [place] [facility] Larae Grooms, NP   1 year ago Acute prostatitis with hematuria   [place] [facility] [place], Corrie Dandy T, NP   1 year ago Urinary symptom or sign   [place]  [facility] Marjie Skiff, NP       Future Appointments             In 3 months Deirdre Evener, MD [facility]   In 5 months Larae Grooms, NP  [facility], [facility]

## 2023-07-21 ENCOUNTER — Ambulatory Visit: Admitting: Dermatology

## 2023-07-24 ENCOUNTER — Other Ambulatory Visit: Payer: Self-pay | Admitting: Nurse Practitioner

## 2023-07-27 ENCOUNTER — Other Ambulatory Visit: Payer: Self-pay | Admitting: Nurse Practitioner

## 2023-07-27 NOTE — Telephone Encounter (Signed)
 Rx 03/31/23 #90 1RF- too soon Requested Prescriptions  Pending Prescriptions Disp Refills   losartan (COZAAR) 25 MG tablet [Pharmacy Med Name: LOSARTAN 25MG  TABLETS] 90 tablet 1    Sig: TAKE 1 TABLET(25 MG) BY MOUTH DAILY     Cardiovascular:  Angiotensin Receptor Blockers Failed - 07/27/2023 10:14 AM      Failed - Valid encounter within last 6 months    Recent Outpatient Visits   None     Future Appointments             In 3 weeks Deirdre Evener, MD [place] [facility]   In 1 month Larae Grooms, NP [place] [facility]            Passed - Cr in normal range and within 180 days    Creatinine, Ser  Date Value Ref Range Status  03/31/2023 0.94 0.76 - 1.27 mg/dL Final         Passed - K in normal range and within 180 days    Potassium  Date Value Ref Range Status  03/31/2023 4.6 3.5 - 5.2 mmol/L Final         Passed - Patient is not pregnant      Passed - Last BP in normal range    BP Readings from Last 1 Encounters:  03/31/23 120/72

## 2023-07-29 NOTE — Telephone Encounter (Signed)
 Requested Prescriptions  Pending Prescriptions Disp Refills   ezetimibe (ZETIA) 10 MG tablet [Pharmacy Med Name: EZETIMIBE 10MG  TABLETS] 90 tablet 0    Sig: TAKE 1 TABLET(10 MG) BY MOUTH DAILY     Cardiovascular:  Antilipid - Sterol Transport Inhibitors Failed - 07/29/2023  9:24 AM      Failed - Valid encounter within last 12 months    Recent Outpatient Visits   None     Future Appointments             In 3 weeks Deirdre Evener, MD [place] [facility]   In 1 month Larae Grooms, NP [place] [facility]            Failed - Lipid Panel in normal range within the last 12 months    Cholesterol, Total  Date Value Ref Range Status  03/31/2023 212 (H) 100 - 199 mg/dL Final   Cholesterol Piccolo, Waived  Date Value Ref Range Status  09/03/2016 176 <200 mg/dL Final    Comment:                            Desirable                <200                         Borderline High      200- 239                         High                     >239    LDL Chol Calc (NIH)  Date Value Ref Range Status  03/31/2023 124 (H) 0 - 99 mg/dL Final   HDL  Date Value Ref Range Status  03/31/2023 74 >39 mg/dL Final   Triglycerides  Date Value Ref Range Status  03/31/2023 82 0 - 149 mg/dL Final   Triglycerides Piccolo,Waived  Date Value Ref Range Status  09/03/2016 87 <150 mg/dL Final    Comment:                            Normal                   <150                         Borderline High     150 - 199                         High                200 - 499                         Very High                >499          Passed - AST in normal range and within 360 days    AST  Date Value Ref Range Status  03/31/2023 19 0 - 40 IU/L Final   AST (SGOT) Piccolo, Waived  Date Value Ref Range Status  09/03/2016 36 11 - 38 U/L Final  Passed - ALT in normal range and within 360 days    ALT  Date Value Ref Range Status  03/31/2023 27 0 - 44  IU/L Final   ALT (SGPT) Piccolo, Waived  Date Value Ref Range Status  09/03/2016 45 10 - 47 U/L Final         Passed - Patient is not pregnant

## 2023-08-23 ENCOUNTER — Ambulatory Visit: Admitting: Dermatology

## 2023-08-23 ENCOUNTER — Encounter: Payer: Self-pay | Admitting: Dermatology

## 2023-08-23 DIAGNOSIS — B353 Tinea pedis: Secondary | ICD-10-CM | POA: Diagnosis not present

## 2023-08-23 DIAGNOSIS — D2361 Other benign neoplasm of skin of right upper limb, including shoulder: Secondary | ICD-10-CM | POA: Diagnosis not present

## 2023-08-23 DIAGNOSIS — Z79899 Other long term (current) drug therapy: Secondary | ICD-10-CM

## 2023-08-23 DIAGNOSIS — D492 Neoplasm of unspecified behavior of bone, soft tissue, and skin: Secondary | ICD-10-CM

## 2023-08-23 DIAGNOSIS — D229 Melanocytic nevi, unspecified: Secondary | ICD-10-CM

## 2023-08-23 DIAGNOSIS — L905 Scar conditions and fibrosis of skin: Secondary | ICD-10-CM

## 2023-08-23 DIAGNOSIS — L814 Other melanin hyperpigmentation: Secondary | ICD-10-CM

## 2023-08-23 DIAGNOSIS — L573 Poikiloderma of Civatte: Secondary | ICD-10-CM

## 2023-08-23 DIAGNOSIS — Z7189 Other specified counseling: Secondary | ICD-10-CM

## 2023-08-23 DIAGNOSIS — D489 Neoplasm of uncertain behavior, unspecified: Secondary | ICD-10-CM

## 2023-08-23 DIAGNOSIS — L821 Other seborrheic keratosis: Secondary | ICD-10-CM

## 2023-08-23 DIAGNOSIS — Z1283 Encounter for screening for malignant neoplasm of skin: Secondary | ICD-10-CM | POA: Diagnosis not present

## 2023-08-23 DIAGNOSIS — L578 Other skin changes due to chronic exposure to nonionizing radiation: Secondary | ICD-10-CM

## 2023-08-23 DIAGNOSIS — B351 Tinea unguium: Secondary | ICD-10-CM

## 2023-08-23 DIAGNOSIS — D1801 Hemangioma of skin and subcutaneous tissue: Secondary | ICD-10-CM

## 2023-08-23 DIAGNOSIS — W908XXA Exposure to other nonionizing radiation, initial encounter: Secondary | ICD-10-CM

## 2023-08-23 DIAGNOSIS — L918 Other hypertrophic disorders of the skin: Secondary | ICD-10-CM

## 2023-08-23 MED ORDER — KETOCONAZOLE 2 % EX CREA: TOPICAL_CREAM | CUTANEOUS | 11 refills | Status: DC

## 2023-08-23 MED ORDER — CICLOPIROX 8 % EX SOLN: Freq: Every day | CUTANEOUS | 6 refills | Status: DC

## 2023-08-23 NOTE — Patient Instructions (Addendum)

## 2023-08-23 NOTE — Progress Notes (Signed)
 Follow-Up Visit   Subjective  Jose Mosley is a 53 y.o. male who presents for the following: Skin Cancer Screening and Full Body Skin Exam spot at left flank , spot at right posterior shoulder that may like removed.  Hx of ak at left tip of nose removed 09/18/2020  The patient presents for Total-Body Skin Exam (TBSE) for skin cancer screening and mole check. The patient has spots, moles and lesions to be evaluated, some may be new or changing and the patient may have concern these could be cancer.  The following portions of the chart were reviewed this encounter and updated as appropriate: medications, allergies, medical history  Review of Systems:  No other skin or systemic complaints except as noted in HPI or Assessment and Plan.  Objective  Well appearing patient in no apparent distress; mood and affect are within normal limits.  A full examination was performed including scalp, head, eyes, ears, nose, lips, neck, chest, axillae, abdomen, back, buttocks, bilateral upper extremities, bilateral lower extremities, hands, feet, fingers, toes, fingernails, and toenails. All findings within normal limits unless otherwise noted below.   Relevant physical exam findings are noted in the Assessment and Plan.     Neck - Posterior Confluent reddish-brown patches right proximal tricep 0.6 cm brown papule    Assessment & Plan   SKIN CANCER SCREENING PERFORMED TODAY.  ACTINIC DAMAGE - Chronic condition, secondary to cumulative UV/sun exposure - diffuse scaly erythematous macules with underlying dyspigmentation - Recommend daily broad spectrum sunscreen SPF 30+ to sun-exposed areas, reapply every 2 hours as needed.  - Staying in the shade or wearing long sleeves, sun glasses (UVA+UVB protection) and wide brim hats (4-inch brim around the entire circumference of the hat) are also recommended for sun protection.  - Call for new or changing lesions.  LENTIGINES, SEBORRHEIC KERATOSES,  HEMANGIOMAS - Benign normal skin lesions - Benign-appearing - Call for any changes  MELANOCYTIC NEVI - Tan-brown and/or pink-flesh-colored symmetric macules and papules - Benign appearing on exam today - Observation - Call clinic for new or changing moles - Recommend daily use of broad spectrum spf 30+ sunscreen to sun-exposed areas.   Acrochordons (Skin Tags) At left flank - Fleshy, skin-colored pedunculated papules - Benign appearing.  - Observe. - If desired, they can be removed with an in office procedure that is not covered by insurance. - Please call the clinic if you notice any new or changing lesions.  Discussed Cosmetic skin tag removal is $115 for up to 15 tags and $20 for each additional.   Scar - chest - Dyspigmented smooth macule or patch. - Benign-appearing.  Observation.  Call clinic for new or changing lesions. Recommend daily broad spectrum sunscreen SPF 30+, reapply every 2 hours as needed. - Recommend Serica moisturizing scar formula cream every night or [facility] brand or Mederma silicone scar sheet every night for the first year after a scar appears to help with scar remodeling if desired. Scars remodel on their own for a full year and will gradually improve in appearance over time.   TINEA PEDIS with Unguium  Exam: Scaling and maceration web spaces and over distal and lateral soles. And thickened toenails see photos  Treatment Plan: Discussed treatment options pill vs cream. Patient prefers topical treatments.   Start Ketoconazole 2 % cream - apply topically to both feet including toenails nightly Start Penlac 8 % solution - apply topically  over nail and surrounding skin. Apply daily over previous coat. After seven (7) days,  may remove with alcohol and continue cycle   Terbinafine Counseling Terbinafine is an anti-fungal medicine that can be applied to the skin (over the counter) or taken by mouth (prescription) to treat fungal infections. The pill version  is often used to treat fungal infections of the nails or scalp. While most people do not have any side effects from taking terbinafine pills, some possible side effects of the medicine can include taste changes, headache, loss of smell, vision changes, nausea, vomiting, or diarrhea.   Rare side effects can include irritation of the liver, allergic reaction, or decrease in blood counts (which may show up as not feeling well or developing an infection). If you are concerned about any of these side effects, please stop the medicine and call your doctor, or in the case of an emergency such as feeling very unwell, seek immediate medical care.   HISTORY OF PRECANCEROUS ACTINIC KERATOSIS OVERLYING FIBROUS PAPULE  At left tip of nose  Bx proven see previous pathology 09/18/2020 Accession: [id number] - site(s) of PreCancerous Actinic Keratosis clear today. - these may recur and new lesions may form requiring treatment to prevent transformation into skin cancer - observe for new or changing spots and contact [facility] for appointment if occur - photoprotection with sun protective clothing; sunglasses and broad spectrum sunscreen with SPF of at least 30 + and frequent self skin exams recommended - yearly exams by a dermatologist recommended for persons with history of PreCancerous Actinic Keratoses  TINEA PEDIS OF BOTH FEET   Related Medications ciclopirox (PENLAC) 8 % solution Apply topically at bedtime. Apply over nail and surrounding skin. Apply daily over previous coat. After seven (7) days, may remove with alcohol and continue cycle. ketoconazole (NIZORAL) 2 % cream Apply topically to both feet including toenails nightly POIKILODERMA OF CIVATTE Neck - Posterior Poikiloderma of [place] is a benign, common and chronic condition, which belongs to the group of melanodermas (pigmented skin disorders). The term 'poikiloderma' refers to a skin change with atrophy where  hypopigmentation/hyperpigmentation changes and dilation of the fine blood vessels (telangiectasia) can be seen in the affected skin.  Benign. Observe   No recommended treatment  NEOPLASM OF UNCERTAIN BEHAVIOR right proximal tricep Epidermal / dermal shaving  Lesion diameter (cm):  0.6 Informed consent: discussed and consent obtained   Timeout: patient name, date of birth, surgical site, and procedure verified   Procedure prep:  Patient was prepped and draped in usual sterile fashion Prep type:  Isopropyl alcohol Anesthesia: the lesion was anesthetized in a standard fashion   Anesthetic:  1% lidocaine  w/ epinephrine 1-100,000 buffered w/ 8.4% NaHCO3 Instrument used: flexible razor blade   Hemostasis achieved with: pressure, aluminum chloride and electrodesiccation   Outcome: patient tolerated procedure well   Post-procedure details: sterile dressing applied and wound care instructions given   Dressing type: bandage and petrolatum   Specimen 1 - Surgical pathology Differential Diagnosis: r/o dermatofibroma   Check Margins: No R/o dermatofibroma  Return in about 1 year (around 08/22/2024) for TBSE.  IRandee Busing, CMA, am acting as scribe for Celine Collard, MD.   Documentation: I have reviewed the above documentation for accuracy and completeness, and I agree with the above.  Celine Collard, MD

## 2023-08-24 ENCOUNTER — Telehealth: Payer: Self-pay

## 2023-08-24 MED ORDER — TERBINAFINE HCL 250 MG PO TABS: 250.0000 mg | ORAL_TABLET | Freq: Every day | ORAL | 0 refills | Status: DC

## 2023-08-24 NOTE — Telephone Encounter (Signed)
 Patient called and advised of information per Dr. Bary Likes. Will call in 1 month to report if any issues and refills to be sent in. aw

## 2023-08-24 NOTE — Telephone Encounter (Signed)
 Patient called office this morning. He has reconsidered therapy offered yesterday and would like to start the Terbinafine. Okay to send in?

## 2023-08-27 LAB — SURGICAL PATHOLOGY

## 2023-08-30 ENCOUNTER — Encounter: Payer: Self-pay | Admitting: Dermatology

## 2023-08-30 ENCOUNTER — Telehealth: Payer: Self-pay

## 2023-08-30 NOTE — Telephone Encounter (Addendum)
 Tried calling patient . No answer. LM for patient to return call.    ----- Message from Celine Collard sent at 08/30/2023 10:13 AM EDT ----- FINAL DIAGNOSIS        1. 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

## 2023-08-30 NOTE — Telephone Encounter (Signed)
-----   Message from Celine Collard sent at 08/30/2023 10:13 AM EDT ----- FINAL DIAGNOSIS        1. 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

## 2023-08-30 NOTE — Telephone Encounter (Signed)
 Discussed biopsy results with patient

## 2023-09-13 ENCOUNTER — Ambulatory Visit: Payer: Self-pay | Admitting: Nurse Practitioner

## 2023-09-17 ENCOUNTER — Ambulatory Visit: Admitting: Nurse Practitioner

## 2023-09-17 ENCOUNTER — Encounter: Payer: Self-pay | Admitting: Nurse Practitioner

## 2023-09-17 VITALS — BP 103/62 | HR 57 | Temp 98.4°F | Resp 15 | Ht 67.99 in | Wt 167.2 lb

## 2023-09-17 DIAGNOSIS — R6882 Decreased libido: Secondary | ICD-10-CM

## 2023-09-17 DIAGNOSIS — E7800 Pure hypercholesterolemia, unspecified: Secondary | ICD-10-CM

## 2023-09-17 DIAGNOSIS — I10 Essential (primary) hypertension: Secondary | ICD-10-CM

## 2023-09-17 MED ORDER — EZETIMIBE 10 MG PO TABS: 10.0000 mg | ORAL_TABLET | Freq: Every day | ORAL | 1 refills | Status: DC

## 2023-09-17 MED ORDER — LOSARTAN POTASSIUM 25 MG PO TABS: 25.0000 mg | ORAL_TABLET | Freq: Every day | ORAL | 1 refills | Status: DC

## 2023-09-17 NOTE — Assessment & Plan Note (Signed)
 Chronic.  Controlled.  Continue with current medication regimen of Zetia .  Had CT Cardiac Scoring done which had a 0% result.   Continue with diet and exercise.  Labs ordered today.  Return to clinic in 6 months for reevaluation.  Call sooner if concerns arise.

## 2023-09-17 NOTE — Assessment & Plan Note (Signed)
 Chronic. Well controlled.  Continue with Losartan  25mg  daily.  Follow up in 6 months.  Call sooner if concerns arise.

## 2023-09-17 NOTE — Progress Notes (Signed)
 BP 103/62 (BP Location: Left Arm, Patient Position: Sitting, Cuff Size: Large)   Pulse (!) 57   Temp 98.4 F (36.9 C) (Oral)   Resp 15   Ht 5' 7.99" (1.727 m)   Wt 167 lb 3.2 oz (75.8 kg)   SpO2 96%   BMI 25.43 kg/m    Subjective:    Patient ID: Jose Mosley, male    DOB: 03/22/1971, 53 y.o.   MRN: [id number]  HPI: Jose Mosley is a 53 y.o. male  Chief Complaint  Patient presents with   Hypertension    No checking at home much lately.    Lab work    [employer] about nail fungal medication he is on and that it can be hard on liver.    HYPERTENSION / HYPERLIPIDEMIA He switched from the Zetia   Satisfied with current treatment? yes Duration of hypertension: years BP monitoring frequency: daily BP range: 120/72 BP medication side effects: no Past BP meds: losartan  (cozaar ) Duration of hyperlipidemia: years Cholesterol medication side effects: no Cholesterol supplements: none Past cholesterol medications: ezetimide (zetia ) Medication compliance: excellent compliance Aspirin: no Recent stressors: no Recurrent headaches: no Visual changes: no Palpitations: no Dyspnea: no Chest pain: no Lower extremity edema: no Dizzy/lightheaded: no  Patient states he is taking Terbinafine .  He has been taking the medication for about a week.   Relevant past medical, surgical, family and social history reviewed and updated as indicated. Interim medical history since our last visit reviewed. Allergies and medications reviewed and updated.  Review of Systems  Eyes:  Negative for visual disturbance.  Respiratory:  Negative for shortness of breath.   Cardiovascular:  Negative for chest pain and leg swelling.  Neurological:  Negative for light-headedness and headaches.    Per HPI unless specifically indicated above     Objective:    BP 103/62 (BP Location: Left Arm, Patient Position: Sitting, Cuff Size: Large)   Pulse (!) 57   Temp 98.4 F (36.9 C) (Oral)   Resp 15    Ht 5' 7.99" (1.727 m)   Wt 167 lb 3.2 oz (75.8 kg)   SpO2 96%   BMI 25.43 kg/m   Wt Readings from Last 3 Encounters:  09/17/23 167 lb 3.2 oz (75.8 kg)  03/31/23 171 lb 6.4 oz (77.7 kg)  11/09/22 178 lb 12.8 oz (81.1 kg)    Physical Exam Vitals and nursing note reviewed.  Constitutional:      General: He is not in acute distress.    Appearance: Normal appearance. He is not ill-appearing, toxic-appearing or diaphoretic.  HENT:     Head: Normocephalic.     Right Ear: External ear normal.     Left Ear: External ear normal.     Nose: Nose normal. No congestion or rhinorrhea.     Mouth/Throat:     Mouth: Mucous membranes are moist.  Eyes:     General:        Right eye: No discharge.        Left eye: No discharge.     Extraocular Movements: Extraocular movements intact.     Conjunctiva/sclera: Conjunctivae normal.     Pupils: Pupils are equal, round, and reactive to light.  Cardiovascular:     Rate and Rhythm: Normal rate and regular rhythm.     Heart sounds: No murmur heard. Pulmonary:     Effort: Pulmonary effort is normal. No respiratory distress.     Breath sounds: Normal breath sounds. No wheezing, rhonchi or rales.  Abdominal:  General: Abdomen is flat. Bowel sounds are normal.  Musculoskeletal:     Cervical back: Normal range of motion and neck supple.  Skin:    General: Skin is warm and dry.     Capillary Refill: Capillary refill takes less than 2 seconds.  Neurological:     General: No focal deficit present.     Mental Status: He is alert and oriented to person, place, and time.  Psychiatric:        Mood and Affect: Mood normal.        Behavior: Behavior normal.        Thought Content: Thought content normal.        Judgment: Judgment normal.     Results for orders placed or performed in visit on 08/23/23  Surgical pathology   Collection Time: 08/23/23 12:00 AM  Result Value Ref Range   SURGICAL PATHOLOGY      SURGICAL PATHOLOGY [facility] [address] [place], [place] [address] Telephone [phone] or [phone] Fax [phone]  REPORT OF DERMATOPATHOLOGY   Accession #: [id number] Patient Name: KERI, TAVELLA Visit # : [id number]  MRN: [id number] Cytotechnologist: Zygmunt Hives, Dermatopathologist, Electronic Signature DOB/Age Apr 02, 1971 (Age: 57) Gender: M Collected Date: 08/23/2023 Received Date: 08/23/2023  FINAL DIAGNOSIS       1. Skin, right proximal tricep :       DERMATOFIBROMA       DATE SIGNED OUT: 08/27/2023 ELECTRONIC SIGNATURE : Depcik-Smith Md, Natalie, Dermatopathologist, Electronic Signature  MICROSCOPIC DESCRIPTION 1. There is a proliferation of relatively uniform spindle cells in the dermis. There are thickened collagen bundles at the periphery of the lesion. There is no atypia. The features are consistent with a dermatofibroma.  CASE COMMENTS STAINS USED IN DIAGNOSIS: H&E    CLINI CAL HISTORY  SPECIMEN(S) OBTAINED 1. Skin, Right Proximal Tricep  SPECIMEN COMMENTS: 1. 0.6 cm brown papule SPECIMEN CLINICAL INFORMATION: 1. Neoplasm of uncertain behavior, R/O dermatofibroma    Gross Description 1. Formalin fixed specimen received:  11 X 7 X 2 MM, TOTO (4 P) (1 B) ( alb )        Report signed out from the following location(s) [place] [address], [place] [address] CLIA #: [id number]  [facility] [address] [place], [place] [address] CLIA #: [id number]       Assessment & Plan:   Problem List Items Addressed This Visit       Cardiovascular and Mediastinum   Essential hypertension - Primary     Other   Hyperlipidemia     Follow up plan: No follow-ups on file.

## 2023-09-18 LAB — TESTOSTERONE, FREE, TOTAL, SHBG
Sex Hormone Binding: 38.8 nmol/L (ref 19.3–76.4)
Testosterone, Free: 13.7 pg/mL (ref 7.2–24.0)
Testosterone: 594 ng/dL (ref 264–916)

## 2023-09-18 LAB — COMPREHENSIVE METABOLIC PANEL WITH GFR
ALT: 19 IU/L (ref 0–44)
AST: 15 IU/L (ref 0–40)
Albumin: 4.4 g/dL (ref 3.8–4.9)
Alkaline Phosphatase: 48 IU/L (ref 44–121)
BUN/Creatinine Ratio: 15 (ref 9–20)
BUN: 13 mg/dL (ref 6–24)
Bilirubin Total: 0.6 mg/dL (ref 0.0–1.2)
CO2: 20 mmol/L (ref 20–29)
Calcium: 9.2 mg/dL (ref 8.7–10.2)
Chloride: 106 mmol/L (ref 96–106)
Creatinine, Ser: 0.87 mg/dL (ref 0.76–1.27)
Globulin, Total: 2 g/dL (ref 1.5–4.5)
Glucose: 83 mg/dL (ref 70–99)
Potassium: 4.9 mmol/L (ref 3.5–5.2)
Sodium: 141 mmol/L (ref 134–144)
Total Protein: 6.4 g/dL (ref 6.0–8.5)
eGFR: 103 mL/min/{1.73_m2} (ref >59)

## 2023-09-18 LAB — LIPID PANEL
Chol/HDL Ratio: 2.4 ratio (ref 0.0–5.0)
Cholesterol, Total: 193 mg/dL (ref 100–199)
HDL: 79 mg/dL (ref >39)
LDL Chol Calc (NIH): 101 mg/dL — ABNORMAL HIGH (ref 0–99)
Triglycerides: 71 mg/dL (ref 0–149)
VLDL Cholesterol Cal: 13 mg/dL (ref 5–40)

## 2023-09-21 ENCOUNTER — Ambulatory Visit: Payer: Self-pay | Admitting: Nurse Practitioner

## 2023-09-23 ENCOUNTER — Other Ambulatory Visit: Payer: Self-pay

## 2023-09-23 MED ORDER — TERBINAFINE HCL 250 MG PO TABS: 250.0000 mg | ORAL_TABLET | Freq: Every day | ORAL | 0 refills | Status: DC

## 2023-09-23 MED ORDER — TERBINAFINE HCL 250 MG PO TABS: 250.0000 mg | ORAL_TABLET | Freq: Every day | ORAL | 1 refills | Status: DC

## 2023-09-23 NOTE — Progress Notes (Signed)
 Patient called stating no side effects from Terbinafine  and doing OK. Two additional refills sent in. aw

## 2023-09-29 ENCOUNTER — Encounter: Payer: Self-pay | Admitting: Nurse Practitioner

## 2023-09-29 DIAGNOSIS — Z1211 Encounter for screening for malignant neoplasm of colon: Secondary | ICD-10-CM

## 2023-10-04 ENCOUNTER — Other Ambulatory Visit: Payer: Self-pay

## 2023-10-04 ENCOUNTER — Telehealth: Payer: Self-pay

## 2023-10-04 DIAGNOSIS — Z860100 Personal history of colon polyps, unspecified: Secondary | ICD-10-CM

## 2023-10-04 MED ORDER — GOLYTELY 236 G PO SOLR
4000.0000 mL | Freq: Once | ORAL | 0 refills | Status: AC
Start: 2023-10-04 — End: 2023-10-04

## 2023-10-04 NOTE — Telephone Encounter (Signed)
 Pt requesting call back to schedule colonoscopy.

## 2023-10-04 NOTE — Telephone Encounter (Signed)
 Gastroenterology Pre-Procedure Review  Request Date: 11/10/23 Requesting Physician: Dr. Cornel Diesel  PATIENT REVIEW QUESTIONS: The patient responded to the following health history questions as indicated:    1. Are you having any GI issues? no 2. Do you have a personal history of Polyps? yes (last colonoscopy performed by Dr. Tully Gainer 08/21/20 recommended repeat in 3 years) 3. Do you have a family history of Colon Cancer or Polyps? no 4. Diabetes Mellitus? no 5. Joint replacements in the past 12 months?no 6. Major health problems in the past 3 months?no 7. Any artificial heart valves, MVP, or defibrillator?no    MEDICATIONS & ALLERGIES:    Patient reports the following regarding taking any anticoagulation/antiplatelet therapy:   Plavix, Coumadin, Eliquis, Xarelto, Lovenox, Pradaxa, Brilinta, or Effient? no Aspirin? no  Patient confirms/reports the following medications:  Current Outpatient Medications  Medication Sig Dispense Refill   ciclopirox  (PENLAC ) 8 % solution Apply topically at bedtime. Apply over nail and surrounding skin. Apply daily over previous coat. After seven (7) days, may remove with alcohol and continue cycle. 6.6 mL 6   ezetimibe  (ZETIA ) 10 MG tablet Take 1 tablet (10 mg total) by mouth daily. 90 tablet 1   ketoconazole  (NIZORAL ) 2 % cream Apply topically to both feet including toenails nightly 30 g 11   losartan  (COZAAR ) 25 MG tablet Take 1 tablet (25 mg total) by mouth daily. 90 tablet 1   terbinafine  (LAMISIL ) 250 MG tablet Take 1 tablet (250 mg total) by mouth daily. 30 tablet 1   No current facility-administered medications for this visit.    Patient confirms/reports the following allergies:  No Known Allergies  No orders of the defined types were placed in this encounter.   AUTHORIZATION INFORMATION Primary Insurance: 1D#: Group #:  Secondary Insurance: 1D#: Group #:  SCHEDULE INFORMATION: Date: 11/10/23 Time: Location: [facility]

## 2023-10-18 ENCOUNTER — Other Ambulatory Visit: Payer: Self-pay

## 2023-10-18 ENCOUNTER — Telehealth: Payer: Self-pay

## 2023-10-18 DIAGNOSIS — Z860100 Personal history of colon polyps, unspecified: Secondary | ICD-10-CM

## 2023-10-18 NOTE — Telephone Encounter (Signed)
 Colonoscopy has been rescheduled from 11/10/23 with Dr. Marinda to 01/05/24 with Dr. Marinda.  Thanks,  [place], CMA

## 2023-11-10 ENCOUNTER — Ambulatory Visit: Admit: 2023-11-10 | Admitting: General Surgery

## 2023-11-10 SURGERY — COLONOSCOPY
Anesthesia: General

## 2024-01-04 ENCOUNTER — Other Ambulatory Visit: Payer: Self-pay | Admitting: Dermatology

## 2024-01-10 ENCOUNTER — Encounter: Payer: Self-pay | Admitting: Nurse Practitioner

## 2024-01-11 ENCOUNTER — Encounter: Payer: Self-pay | Admitting: Dermatology

## 2024-01-11 ENCOUNTER — Ambulatory Visit (INDEPENDENT_AMBULATORY_CARE_PROVIDER_SITE_OTHER): Admitting: Dermatology

## 2024-01-11 DIAGNOSIS — D1724 Benign lipomatous neoplasm of skin and subcutaneous tissue of left leg: Secondary | ICD-10-CM

## 2024-01-11 DIAGNOSIS — Z7189 Other specified counseling: Secondary | ICD-10-CM

## 2024-01-11 NOTE — Patient Instructions (Signed)

## 2024-01-11 NOTE — Progress Notes (Signed)
   Follow-Up Visit   Subjective  Jose Mosley is a 53 y.o. male who presents for the following: recently noticed a lump at upper inner thigh, not painful.  The following portions of the chart were reviewed this encounter and updated as appropriate: medications, allergies, medical history  Review of Systems:  No other skin or systemic complaints except as noted in HPI or Assessment and Plan.  Objective  Well appearing patient in no apparent distress; mood and affect are within normal limits.  A focused examination was performed of the following areas: Left thigh  Relevant exam findings are noted in the Assessment and Plan.   Assessment & Plan   Lipoma  Exam: Subcutaneous rubbery nodule 1.1 cm x 0.6 cm Location: left proximal medial thigh near groin  Benign-appearing. Exam most consistent with an Lipoma. Discussed that a Lipoma is a benign fatty growth that can grow over time and sometimes become painful or otherwise symptomatic. Some patients may have one or several lipomas.. Benign Hereditary Lipomatosis is a hereditary familial condition where family members tend to grow multiple lipomas.  Recommend observation if it is not changing, growing or symptomatic. Recommend surgical excision to remove it if it is painful, growing, symptomatic, or other changes noted. Please contact our office for new or changing lesions so they can be evaluated.   Return for TBSE, with Dr. MARLA, as scheduled.  LILLETTE Lonell Drones, RMA, am acting as scribe for Alm Rhyme, MD .   Documentation: I have reviewed the above documentation for accuracy and completeness, and I agree with the above.  Alm Rhyme, MD

## 2024-01-25 ENCOUNTER — Telehealth: Payer: Self-pay

## 2024-01-25 NOTE — Telephone Encounter (Signed)
 Pt would like to reschedule colonoscopy from 02/23/2024

## 2024-01-26 ENCOUNTER — Telehealth: Payer: Self-pay

## 2024-01-26 NOTE — Telephone Encounter (Signed)
 Pt requested to cancel colonoscopy due to work schedule.  Will call back to reschedule.  Trish in Endo has been made aware of cancellation request for 02/23/24 with Dr. Marinda.  Thanks,  [place], CMA

## 2024-02-09 ENCOUNTER — Other Ambulatory Visit: Payer: Self-pay | Admitting: Nurse Practitioner

## 2024-02-10 NOTE — Telephone Encounter (Signed)
 Requested Prescriptions  Refused Prescriptions Disp Refills   losartan  (COZAAR ) 25 MG tablet [Pharmacy Med Name: LOSARTAN  25MG  TABLETS] 90 tablet 1    Sig: TAKE 1 TABLET(25 MG) BY MOUTH DAILY     Cardiovascular:  Angiotensin Receptor Blockers Passed - 02/10/2024  5:11 PM      Passed - Cr in normal range and within 180 days    Creatinine, Ser  Date Value Ref Range Status  09/17/2023 0.87 0.76 - 1.27 mg/dL Final         Passed - K in normal range and within 180 days    Potassium  Date Value Ref Range Status  09/17/2023 4.9 3.5 - 5.2 mmol/L Final         Passed - Patient is not pregnant      Passed - Last BP in normal range    BP Readings from Last 1 Encounters:  09/17/23 103/62         Passed - Valid encounter within last 6 months    Recent Outpatient Visits           4 months ago Essential hypertension   [facility] Melvin Pao, NP       Future Appointments             In 1 month Melvin Pao, NP [place] [facility], [address]   In 6 months Hester Alm BROCKS, MD [facility]

## 2024-02-23 ENCOUNTER — Ambulatory Visit: Admit: 2024-02-23 | Admitting: General Surgery

## 2024-02-23 SURGERY — COLONOSCOPY
Anesthesia: General

## 2024-03-27 ENCOUNTER — Encounter: Payer: Self-pay | Admitting: Nurse Practitioner

## 2024-03-27 ENCOUNTER — Ambulatory Visit: Admitting: Nurse Practitioner

## 2024-03-27 VITALS — BP 104/66 | HR 64 | Temp 98.0°F | Ht 67.72 in | Wt 166.4 lb

## 2024-03-27 DIAGNOSIS — I10 Essential (primary) hypertension: Secondary | ICD-10-CM

## 2024-03-27 DIAGNOSIS — E7800 Pure hypercholesterolemia, unspecified: Secondary | ICD-10-CM

## 2024-03-27 DIAGNOSIS — Z1211 Encounter for screening for malignant neoplasm of colon: Secondary | ICD-10-CM

## 2024-03-27 DIAGNOSIS — Z0000 Encounter for general adult medical examination without abnormal findings: Secondary | ICD-10-CM

## 2024-03-27 MED ORDER — LOSARTAN POTASSIUM 25 MG PO TABS: 25.0000 mg | ORAL_TABLET | Freq: Every day | ORAL | 1 refills | Status: AC

## 2024-03-27 MED ORDER — EZETIMIBE 10 MG PO TABS: 10.0000 mg | ORAL_TABLET | Freq: Every day | ORAL | 1 refills | Status: AC

## 2024-03-27 NOTE — Progress Notes (Signed)
 BP 104/66 (BP Location: Left Arm, Patient Position: Sitting, Cuff Size: Large)   Pulse 64   Temp 98 F (36.7 C) (Oral)   Ht 5' 7.72 (1.72 m)   Wt 166 lb 6.4 oz (75.5 kg)   SpO2 97%   BMI 25.51 kg/m    Subjective:    Patient ID: Jose Mosley, male    DOB: 12/21/1970, 53 y.o.   MRN: [id number]  HPI: Jose Mosley is a 53 y.o. male presenting on 03/27/2024 for comprehensive medical examination. Current medical complaints include:none  He currently lives with: Interim Problems from his last visit: no  HYPERTENSION / HYPERLIPIDEMIA Satisfied with current treatment? no Duration of hypertension: years BP monitoring frequency: not checking BP range:  BP medication side effects: no Past BP meds: Losartan  Duration of hyperlipidemia: years Cholesterol medication side effects: no Cholesterol supplements: none Past cholesterol medications: rosuvastatin  (crestor ) Medication compliance: excellent compliance Aspirin: no Recent stressors: no Recurrent headaches: no Visual changes: no Palpitations: no Dyspnea: no Chest pain: no Lower extremity edema: no Dizzy/lightheaded: no  The 10-year ASCVD risk score (Arnett DK, et al., 2019) is: 2.6%   Values used to calculate the score:     Age: 14 years     Clincally relevant sex: Male     Is Non-Hispanic African American: No     Diabetic: No     Tobacco smoker: No     Systolic Blood Pressure: 104 mmHg     Is BP treated: Yes     HDL Cholesterol: 79 mg/dL     Total Cholesterol: 193 mg/dL    Depression Screen done today and results listed below:     03/27/2024    8:42 AM 09/17/2023    9:24 AM 03/31/2023    9:39 AM 11/09/2022    2:49 PM 10/14/2022   10:31 AM  Depression screen PHQ 2/9  Decreased Interest 0 0 0 0 0  Down, Depressed, Hopeless 0 0 0 0 0  PHQ - 2 Score 0 0 0 0 0  Altered sleeping 0 0 0 0 0  Tired, decreased energy 0 0 0 0 0  Change in appetite 0 0 0 0 0  Feeling bad or failure about yourself  0 0 0 0 0   Trouble concentrating 0 0 0 0 0  Moving slowly or fidgety/restless 0 0 0 0 0  Suicidal thoughts 0 0 0 0 0  PHQ-9 Score 0 0  0  0  0   Difficult doing work/chores Not difficult at all   Not difficult at all Not difficult at all     Data saved with a previous flowsheet row definition    The patient does not have a history of falls. I did complete a risk assessment for falls. A plan of care for falls was documented.   Past Medical History:  Past Medical History:  Diagnosis Date   Hyperlipidemia     Surgical History:  Past Surgical History:  Procedure Laterality Date   COLONOSCOPY WITH PROPOFOL  N/A 08/21/2020   Procedure: COLONOSCOPY WITH PROPOFOL ;  Surgeon: Janalyn Keene NOVAK, MD;  Location: [facility] ENDOSCOPY;  Service: Endoscopy;  Laterality: N/A;   DISTAL BICEPS TENDON REPAIR Left 11/10/2019   FINGER SURGERY      Medications:  No current outpatient medications on file prior to visit.   No current facility-administered medications on file prior to visit.    Allergies:  No Known Allergies  Social History:  Social History   Socioeconomic History  Marital status: Married    Spouse name: Not on file   Number of children: Not on file   Years of education: Not on file   Highest education level: Not on file  Occupational History   Not on file  Tobacco Use   Smoking status: Former    Current packs/day: 0.00    Types: Cigarettes    Quit date: 03/06/1990    Years since quitting: 34.0   Smokeless tobacco: Never  Vaping Use   Vaping status: Never Used  Substance and Sexual Activity   Alcohol use: No    Alcohol/week: 0.0 standard drinks of alcohol   Drug use: No   Sexual activity: Yes  Other Topics Concern   Not on file  Social History Narrative   Not on file   Social Drivers of Health   Financial Resource Strain: Not on file  Food Insecurity: Not on file  Transportation Needs: Not on file  Physical Activity: Not on file  Stress: Not on file  Social  Connections: Not on file  Intimate Partner Violence: Not on file   Social History   Tobacco Use  Smoking Status Former   Current packs/day: 0.00   Types: Cigarettes   Quit date: 03/06/1990   Years since quitting: 34.0  Smokeless Tobacco Never   Social History   Substance and Sexual Activity  Alcohol Use No   Alcohol/week: 0.0 standard drinks of alcohol    Family History:  Family History  Problem Relation Age of Onset   Alcohol abuse Mother    Diabetes Mother    Heart disease Mother    Hyperlipidemia Mother    Stroke Mother    Heart disease Father    Hyperlipidemia Father    Hypertension Father    Stroke Father     Past medical history, surgical history, medications, allergies, family history and social history reviewed with patient today and changes made to appropriate areas of the chart.   Review of Systems  Eyes:  Negative for blurred vision and double vision.  Respiratory:  Negative for shortness of breath.   Cardiovascular:  Negative for chest pain, palpitations and leg swelling.  Neurological:  Negative for dizziness and headaches.   All other ROS negative except what is listed above and in the HPI.      Objective:    BP 104/66 (BP Location: Left Arm, Patient Position: Sitting, Cuff Size: Large)   Pulse 64   Temp 98 F (36.7 C) (Oral)   Ht 5' 7.72 (1.72 m)   Wt 166 lb 6.4 oz (75.5 kg)   SpO2 97%   BMI 25.51 kg/m   Wt Readings from Last 3 Encounters:  03/27/24 166 lb 6.4 oz (75.5 kg)  09/17/23 167 lb 3.2 oz (75.8 kg)  03/31/23 171 lb 6.4 oz (77.7 kg)    Physical Exam Vitals and nursing note reviewed.  Constitutional:      General: He is not in acute distress.    Appearance: Normal appearance. He is not ill-appearing, toxic-appearing or diaphoretic.  HENT:     Head: Normocephalic.     Right Ear: Tympanic membrane, ear canal and external ear normal.     Left Ear: Tympanic membrane, ear canal and external ear normal.     Nose: Nose normal. No  congestion or rhinorrhea.     Mouth/Throat:     Mouth: Mucous membranes are moist.  Eyes:     General:        Right eye: No discharge.  Left eye: No discharge.     Extraocular Movements: Extraocular movements intact.     Conjunctiva/sclera: Conjunctivae normal.     Pupils: Pupils are equal, round, and reactive to light.  Cardiovascular:     Rate and Rhythm: Normal rate and regular rhythm.     Heart sounds: No murmur heard. Pulmonary:     Effort: Pulmonary effort is normal. No respiratory distress.     Breath sounds: Normal breath sounds. No wheezing, rhonchi or rales.  Abdominal:     General: Abdomen is flat. Bowel sounds are normal. There is no distension.     Palpations: Abdomen is soft.     Tenderness: There is no abdominal tenderness. There is no guarding.  Musculoskeletal:     Cervical back: Normal range of motion and neck supple.  Skin:    General: Skin is warm and dry.     Capillary Refill: Capillary refill takes less than 2 seconds.  Neurological:     General: No focal deficit present.     Mental Status: He is alert and oriented to person, place, and time.     Cranial Nerves: No cranial nerve deficit.     Motor: No weakness.     Deep Tendon Reflexes: Reflexes normal.  Psychiatric:        Mood and Affect: Mood normal.        Behavior: Behavior normal.        Thought Content: Thought content normal.        Judgment: Judgment normal.     Results for orders placed or performed in visit on 09/17/23  Comprehensive metabolic panel with GFR   Collection Time: 09/17/23  9:44 AM  Result Value Ref Range   Glucose 83 70 - 99 mg/dL   BUN 13 6 - 24 mg/dL   Creatinine, Ser 9.12 0.76 - 1.27 mg/dL   eGFR 896 >40 fO/fpw/8.26   BUN/Creatinine Ratio 15 9 - 20   Sodium 141 134 - 144 mmol/L   Potassium 4.9 3.5 - 5.2 mmol/L   Chloride 106 96 - 106 mmol/L   CO2 20 20 - 29 mmol/L   Calcium  9.2 8.7 - 10.2 mg/dL   Total Protein 6.4 6.0 - 8.5 g/dL   Albumin 4.4 3.8 - 4.9 g/dL    Globulin, Total 2.0 1.5 - 4.5 g/dL   Bilirubin Total 0.6 0.0 - 1.2 mg/dL   Alkaline Phosphatase 48 44 - 121 IU/L   AST 15 0 - 40 IU/L   ALT 19 0 - 44 IU/L  Lipid panel   Collection Time: 09/17/23  9:44 AM  Result Value Ref Range   Cholesterol, Total 193 100 - 199 mg/dL   Triglycerides 71 0 - 149 mg/dL   HDL 79 >60 mg/dL   VLDL Cholesterol Cal 13 5 - 40 mg/dL   LDL Chol Calc (NIH) 898 (H) 0 - 99 mg/dL   Chol/HDL Ratio 2.4 0.0 - 5.0 ratio  Testosterone , free, total([facility])   Collection Time: 09/17/23  9:44 AM  Result Value Ref Range   Testosterone  594 264 - 916 ng/dL   Testosterone , Free 13.7 7.2 - 24.0 pg/mL   Sex Hormone Binding 38.8 19.3 - 76.4 nmol/L      Assessment & Plan:   Problem List Items Addressed This Visit       Cardiovascular and Mediastinum   Essential hypertension    Chronic. Well controlled.  Patient requesting to switch to alternative medication due to feeling like he has something in his  throat. Will switch from Lisinopril  to Losartan .  Follow up in 6 months.  Call sooner if concerns arise.       Relevant Medications   ezetimibe  (ZETIA ) 10 MG tablet   losartan  (COZAAR ) 25 MG tablet     Other   Pure hypercholesterolemia    Chronic.  Has been on rosuvastatin . Would like to switch to Zetia .  Discussed benefits of Rosuvastatin  over Zetia .  Patient would like to try medication for 6 months.  Will repeat labs in 6 months.        Relevant Medications   ezetimibe  (ZETIA ) 10 MG tablet   losartan  (COZAAR ) 25 MG tablet   Other Relevant Orders   Lipid panel   Apo A1 + B + Ratio   Lipoprotein A (LPA)   Testosterone , free, total([facility])   HgB A1c   Insulin , Free and Total   CT CARDIAC SCORING (SELF PAY ONLY)   Other Visit Diagnoses     Annual physical exam    -  Primary   Health maintenance reviewed during visit today.  Labs ordered.  Vaccines reviewed.  Due for Colonoscopy next year. Would like Calcium  scoring test- ordered.  Patient  requested additional labs to be drawn at visit today.  Aware that insurance may not cover them.   Relevant Orders   TSH   PSA   Lipid panel   CBC with Differential/Platelet   Comprehensive metabolic panel   Urinalysis, Routine w reflex microscopic        Discussed aspirin prophylaxis for myocardial infarction prevention and decision was it was not indicated  LABORATORY TESTING:  Health maintenance labs ordered today as discussed above.   The natural history of prostate cancer and ongoing controversy regarding screening and potential treatment outcomes of prostate cancer has been discussed with the patient. The meaning of a false positive PSA and a false negative PSA has been discussed. He indicates understanding of the limitations of this screening test and wishes to proceed with screening PSA testing.   IMMUNIZATIONS:   - Tdap: Tetanus vaccination status reviewed: last tetanus booster within 10 years. - Influenza: Refused - Pneumovax: Not applicable - Prevnar: Not applicable - HPV: Not applicable - Zostavax vaccine: Refused  SCREENING: - Colonoscopy: Up to date  Discussed with patient purpose of the colonoscopy is to detect colon cancer at curable precancerous or early stages   - AAA Screening: Not applicable  -Hearing Test: Not applicable  -Spirometry: Not applicable   PATIENT COUNSELING:    Sexuality: Discussed sexually transmitted diseases, partner selection, use of condoms, avoidance of unintended pregnancy  and contraceptive alternatives.   Advised to avoid cigarette smoking.  I discussed with the patient that most people either abstain from alcohol or drink within safe limits (<=14/week and <=4 drinks/occasion for males, <=7/weeks and <= 3 drinks/occasion for females) and that the risk for alcohol disorders and other health effects rises proportionally with the number of drinks per week and how often a drinker exceeds daily limits.  Discussed cessation/primary  prevention of drug use and availability of treatment for abuse.   Diet: Encouraged to adjust caloric intake to maintain  or achieve ideal body weight, to reduce intake of dietary saturated fat and total fat, to limit sodium intake by avoiding high sodium foods and not adding table salt, and to maintain adequate dietary potassium and calcium  preferably from fresh fruits, vegetables, and low-fat dairy products.    stressed the importance of regular exercise  Injury prevention: Discussed safety belts, safety  helmets, smoke detector, smoking near bedding or upholstery.   Dental health: Discussed importance of regular tooth brushing, flossing, and dental visits.   Follow up plan: NEXT PREVENTATIVE PHYSICAL DUE IN 1 YEAR. Return in about 6 months (around 09/25/2024) for HTN, HLD, DM2 FU.

## 2024-03-27 NOTE — Assessment & Plan Note (Signed)
 Chronic.  Controlled.  Continue with current medication regimen of Zetia .  Had CT Cardiac Scoring done which had a 0% result in 2024.   ASCVD risk score 2.6%.  Continue with diet and exercise.  Labs ordered today.  Return to clinic in 6 months for reevaluation.  Call sooner if concerns arise.

## 2024-03-27 NOTE — Assessment & Plan Note (Signed)
 Chronic. Well controlled.  Continue with Losartan  25mg  daily.  Refills sent today.  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  Labs ordered.  Follow up in 6 months.  Call sooner if concerns arise.

## 2024-03-28 ENCOUNTER — Ambulatory Visit: Payer: Self-pay | Admitting: Nurse Practitioner

## 2024-03-28 LAB — CBC WITH DIFFERENTIAL/PLATELET
Basophils Absolute: 0.1 x10E3/uL (ref 0.0–0.2)
Basos: 1 %
EOS (ABSOLUTE): 0.1 x10E3/uL (ref 0.0–0.4)
Eos: 2 %
Hematocrit: 44.7 % (ref 37.5–51.0)
Hemoglobin: 15.2 g/dL (ref 13.0–17.7)
Immature Grans (Abs): 0 x10E3/uL (ref 0.0–0.1)
Immature Granulocytes: 0 %
Lymphocytes Absolute: 1.3 x10E3/uL (ref 0.7–3.1)
Lymphs: 20 %
MCH: 31.4 pg (ref 26.6–33.0)
MCHC: 34 g/dL (ref 31.5–35.7)
MCV: 92 fL (ref 79–97)
Monocytes Absolute: 0.5 x10E3/uL (ref 0.1–0.9)
Monocytes: 8 %
Neutrophils Absolute: 4.7 x10E3/uL (ref 1.4–7.0)
Neutrophils: 69 %
Platelets: 245 x10E3/uL (ref 150–450)
RBC: 4.84 x10E6/uL (ref 4.14–5.80)
RDW: 12.6 % (ref 11.6–15.4)
WBC: 6.7 x10E3/uL (ref 3.4–10.8)

## 2024-03-28 LAB — LIPID PANEL
Chol/HDL Ratio: 2.6 ratio (ref 0.0–5.0)
Cholesterol, Total: 194 mg/dL (ref 100–199)
HDL: 75 mg/dL (ref >39)
LDL Chol Calc (NIH): 109 mg/dL — ABNORMAL HIGH (ref 0–99)
Triglycerides: 50 mg/dL (ref 0–149)
VLDL Cholesterol Cal: 10 mg/dL (ref 5–40)

## 2024-03-28 LAB — COMPREHENSIVE METABOLIC PANEL WITH GFR
ALT: 24 IU/L (ref 0–44)
AST: 22 IU/L (ref 0–40)
Albumin: 4.4 g/dL (ref 3.8–4.9)
Alkaline Phosphatase: 49 IU/L (ref 47–123)
BUN/Creatinine Ratio: 8 — ABNORMAL LOW (ref 9–20)
BUN: 8 mg/dL (ref 6–24)
Bilirubin Total: 0.6 mg/dL (ref 0.0–1.2)
CO2: 23 mmol/L (ref 20–29)
Calcium: 9.2 mg/dL (ref 8.7–10.2)
Chloride: 104 mmol/L (ref 96–106)
Creatinine, Ser: 0.99 mg/dL (ref 0.76–1.27)
Globulin, Total: 1.7 g/dL (ref 1.5–4.5)
Glucose: 88 mg/dL (ref 70–99)
Potassium: 4.8 mmol/L (ref 3.5–5.2)
Sodium: 138 mmol/L (ref 134–144)
Total Protein: 6.1 g/dL (ref 6.0–8.5)
eGFR: 91 mL/min/1.73 (ref >59)

## 2024-03-28 LAB — TSH: TSH: 0.739 u[IU]/mL (ref 0.450–4.500)

## 2024-03-28 LAB — PSA: Prostate Specific Ag, Serum: 1.2 ng/mL (ref 0.0–4.0)

## 2024-03-29 ENCOUNTER — Other Ambulatory Visit: Payer: Self-pay

## 2024-03-29 ENCOUNTER — Telehealth: Payer: Self-pay

## 2024-03-29 DIAGNOSIS — Z860100 Personal history of colon polyps, unspecified: Secondary | ICD-10-CM

## 2024-03-29 MED ORDER — NA SULFATE-K SULFATE-MG SULF 17.5-3.13-1.6 GM/177ML PO SOLN: 354.0000 mL | Freq: Once | ORAL | 0 refills | Status: AC

## 2024-03-29 NOTE — Telephone Encounter (Signed)
 Error

## 2024-03-29 NOTE — Telephone Encounter (Signed)
 Gastroenterology Pre-Procedure Review  Request Date: 05/22/2024 Requesting Physician: Dr. Melany  PATIENT REVIEW QUESTIONS: The patient responded to the following health history questions as indicated:    1. Are you having any GI issues? no 2. Do you have a personal history of Polyps? yes (08/21/2020 Dr. Janalyn Polyps) 3. Do you have a family history of Colon Cancer or Polyps? no 4. Diabetes Mellitus? no 5. Joint replacements in the past 12 months?no 6. Major health problems in the past 3 months?no 7. Any artificial heart valves, MVP, or defibrillator?no    MEDICATIONS & ALLERGIES:    Patient reports the following regarding taking any anticoagulation/antiplatelet therapy:   Plavix, Coumadin, Eliquis, Xarelto, Lovenox, Pradaxa, Brilinta, or Effient? no Aspirin? no  Patient confirms/reports the following medications:  Current Outpatient Medications  Medication Sig Dispense Refill   ezetimibe  (ZETIA ) 10 MG tablet Take 1 tablet (10 mg total) by mouth daily. 90 tablet 1   losartan  (COZAAR ) 25 MG tablet Take 1 tablet (25 mg total) by mouth daily. 90 tablet 1   No current facility-administered medications for this visit.    Patient confirms/reports the following allergies:  No Known Allergies  No orders of the defined types were placed in this encounter.   AUTHORIZATION INFORMATION Primary Insurance: 1D#: Group #:  Secondary Insurance: 1D#: Group #:  SCHEDULE INFORMATION: Date: 05/22/2024 Time: Location: [facility] Dr. Melany

## 2024-05-18 ENCOUNTER — Encounter: Payer: Self-pay | Admitting: Gastroenterology

## 2024-05-22 HISTORY — DX: Essential (primary) hypertension: I10

## 2024-06-19 ENCOUNTER — Ambulatory Visit: Admission: RE | Admit: 2024-06-19 | Source: Home / Self Care | Admitting: Gastroenterology

## 2024-06-19 ENCOUNTER — Encounter: Payer: Self-pay | Admitting: Anesthesiology

## 2024-06-19 ENCOUNTER — Encounter: Admission: RE | Payer: Self-pay | Source: Home / Self Care

## 2024-08-22 ENCOUNTER — Ambulatory Visit: Admitting: Dermatology

## 2024-10-09 ENCOUNTER — Ambulatory Visit: Admitting: Nurse Practitioner
